# Patient Record
Sex: Female | Born: 1965
Health system: Southern US, Community
[De-identification: ages and names within clinical notes are randomized; demographics above are authoritative.]

## PROBLEM LIST (undated history)

## (undated) DIAGNOSIS — F419 Anxiety disorder, unspecified: Secondary | ICD-10-CM

## (undated) DIAGNOSIS — R87619 Unspecified abnormal cytological findings in specimens from cervix uteri: Secondary | ICD-10-CM

## (undated) DIAGNOSIS — F32A Depression, unspecified: Secondary | ICD-10-CM

## (undated) DIAGNOSIS — F329 Major depressive disorder, single episode, unspecified: Secondary | ICD-10-CM

## (undated) DIAGNOSIS — R5383 Other fatigue: Secondary | ICD-10-CM

## (undated) HISTORY — DX: Major depressive disorder, single episode, unspecified: F32.9

## (undated) HISTORY — DX: Depression, unspecified: F32.A

## (undated) HISTORY — DX: Anxiety disorder, unspecified: F41.9

## (undated) HISTORY — DX: Unspecified abnormal cytological findings in specimens from cervix uteri: R87.619

## (undated) HISTORY — DX: Other fatigue: R53.83

## (undated) HISTORY — PX: COLPOSCOPY: SHX161

---

## 1999-03-24 ENCOUNTER — Other Ambulatory Visit: Admission: RE | Admit: 1999-03-24 | Discharge: 1999-03-24 | Payer: Self-pay | Admitting: Obstetrics and Gynecology

## 2000-04-13 ENCOUNTER — Other Ambulatory Visit: Admission: RE | Admit: 2000-04-13 | Discharge: 2000-04-13 | Payer: Self-pay | Admitting: Obstetrics and Gynecology

## 2000-06-16 ENCOUNTER — Encounter: Payer: Self-pay | Admitting: Obstetrics and Gynecology

## 2000-06-16 ENCOUNTER — Ambulatory Visit (HOSPITAL_COMMUNITY): Admission: RE | Admit: 2000-06-16 | Discharge: 2000-06-16 | Payer: Self-pay | Admitting: Obstetrics and Gynecology

## 2001-06-06 ENCOUNTER — Other Ambulatory Visit: Admission: RE | Admit: 2001-06-06 | Discharge: 2001-06-06 | Payer: Self-pay | Admitting: Obstetrics and Gynecology

## 2002-06-11 ENCOUNTER — Other Ambulatory Visit: Admission: RE | Admit: 2002-06-11 | Discharge: 2002-06-11 | Payer: Self-pay | Admitting: Obstetrics and Gynecology

## 2002-07-31 ENCOUNTER — Encounter: Admission: RE | Admit: 2002-07-31 | Discharge: 2002-07-31 | Payer: Self-pay | Admitting: Obstetrics and Gynecology

## 2002-07-31 ENCOUNTER — Encounter: Payer: Self-pay | Admitting: Obstetrics and Gynecology

## 2003-02-06 ENCOUNTER — Encounter: Admission: RE | Admit: 2003-02-06 | Discharge: 2003-02-06 | Payer: Self-pay | Admitting: Obstetrics and Gynecology

## 2003-02-06 ENCOUNTER — Encounter: Payer: Self-pay | Admitting: Obstetrics and Gynecology

## 2003-09-27 ENCOUNTER — Emergency Department (HOSPITAL_COMMUNITY): Admission: EM | Admit: 2003-09-27 | Discharge: 2003-09-27 | Payer: Self-pay | Admitting: Emergency Medicine

## 2004-07-28 ENCOUNTER — Other Ambulatory Visit: Admission: RE | Admit: 2004-07-28 | Discharge: 2004-07-28 | Payer: Self-pay | Admitting: Obstetrics and Gynecology

## 2005-06-15 ENCOUNTER — Encounter: Admission: RE | Admit: 2005-06-15 | Discharge: 2005-06-15 | Payer: Self-pay | Admitting: General Surgery

## 2005-07-01 ENCOUNTER — Encounter: Admission: RE | Admit: 2005-07-01 | Discharge: 2005-07-01 | Payer: Self-pay | Admitting: General Surgery

## 2005-07-29 ENCOUNTER — Other Ambulatory Visit: Admission: RE | Admit: 2005-07-29 | Discharge: 2005-07-29 | Payer: Self-pay | Admitting: Obstetrics and Gynecology

## 2006-06-23 ENCOUNTER — Encounter: Admission: RE | Admit: 2006-06-23 | Discharge: 2006-06-23 | Payer: Self-pay | Admitting: General Surgery

## 2006-08-11 ENCOUNTER — Other Ambulatory Visit: Admission: RE | Admit: 2006-08-11 | Discharge: 2006-08-11 | Payer: Self-pay | Admitting: Obstetrics and Gynecology

## 2006-11-17 ENCOUNTER — Encounter: Admission: RE | Admit: 2006-11-17 | Discharge: 2006-11-17 | Payer: Self-pay | Admitting: Obstetrics and Gynecology

## 2007-06-26 ENCOUNTER — Encounter: Admission: RE | Admit: 2007-06-26 | Discharge: 2007-06-26 | Payer: Self-pay | Admitting: Obstetrics and Gynecology

## 2007-08-14 ENCOUNTER — Other Ambulatory Visit: Admission: RE | Admit: 2007-08-14 | Discharge: 2007-08-14 | Payer: Self-pay | Admitting: Obstetrics and Gynecology

## 2008-07-01 ENCOUNTER — Encounter: Admission: RE | Admit: 2008-07-01 | Discharge: 2008-07-01 | Payer: Self-pay | Admitting: Obstetrics and Gynecology

## 2008-08-15 ENCOUNTER — Other Ambulatory Visit: Admission: RE | Admit: 2008-08-15 | Discharge: 2008-08-15 | Payer: Self-pay | Admitting: Obstetrics and Gynecology

## 2009-07-02 ENCOUNTER — Encounter: Admission: RE | Admit: 2009-07-02 | Discharge: 2009-07-02 | Payer: Self-pay | Admitting: Obstetrics and Gynecology

## 2009-07-31 ENCOUNTER — Emergency Department (HOSPITAL_COMMUNITY): Admission: EM | Admit: 2009-07-31 | Discharge: 2009-07-31 | Payer: Self-pay | Admitting: Family Medicine

## 2009-08-18 ENCOUNTER — Other Ambulatory Visit: Admission: RE | Admit: 2009-08-18 | Discharge: 2009-08-18 | Payer: Self-pay | Admitting: Obstetrics and Gynecology

## 2010-07-17 ENCOUNTER — Encounter: Admission: RE | Admit: 2010-07-17 | Discharge: 2010-07-17 | Payer: Self-pay | Admitting: Obstetrics and Gynecology

## 2010-08-19 ENCOUNTER — Other Ambulatory Visit
Admission: RE | Admit: 2010-08-19 | Discharge: 2010-08-19 | Payer: Self-pay | Source: Home / Self Care | Admitting: Obstetrics and Gynecology

## 2010-09-20 ENCOUNTER — Encounter: Payer: Self-pay | Admitting: Obstetrics and Gynecology

## 2010-12-01 LAB — POCT RAPID STREP A (OFFICE): Streptococcus, Group A Screen (Direct): POSITIVE — AB

## 2011-01-28 ENCOUNTER — Inpatient Hospital Stay (INDEPENDENT_AMBULATORY_CARE_PROVIDER_SITE_OTHER)
Admission: RE | Admit: 2011-01-28 | Discharge: 2011-01-28 | Disposition: A | Discharge status: Home / Self Care | Payer: 59 | Source: Ambulatory Visit | Attending: Family Medicine | Admitting: Family Medicine

## 2011-01-28 DIAGNOSIS — R040 Epistaxis: Secondary | ICD-10-CM

## 2011-01-28 DIAGNOSIS — J019 Acute sinusitis, unspecified: Secondary | ICD-10-CM

## 2011-06-22 ENCOUNTER — Other Ambulatory Visit: Payer: Self-pay | Admitting: Family Medicine

## 2011-06-22 DIAGNOSIS — Z1231 Encounter for screening mammogram for malignant neoplasm of breast: Secondary | ICD-10-CM

## 2011-07-20 ENCOUNTER — Ambulatory Visit
Admission: RE | Admit: 2011-07-20 | Discharge: 2011-07-20 | Disposition: A | Discharge status: Home / Self Care | Payer: 59 | Source: Ambulatory Visit | Attending: Family Medicine | Admitting: Family Medicine

## 2011-07-20 DIAGNOSIS — Z1231 Encounter for screening mammogram for malignant neoplasm of breast: Secondary | ICD-10-CM

## 2012-06-13 ENCOUNTER — Other Ambulatory Visit: Payer: Self-pay | Admitting: Obstetrics and Gynecology

## 2012-06-13 DIAGNOSIS — Z1231 Encounter for screening mammogram for malignant neoplasm of breast: Secondary | ICD-10-CM

## 2012-07-20 ENCOUNTER — Ambulatory Visit
Admission: RE | Admit: 2012-07-20 | Discharge: 2012-07-20 | Disposition: A | Discharge status: Home / Self Care | Payer: 59 | Source: Ambulatory Visit | Attending: Obstetrics and Gynecology | Admitting: Obstetrics and Gynecology

## 2012-07-20 DIAGNOSIS — Z1231 Encounter for screening mammogram for malignant neoplasm of breast: Secondary | ICD-10-CM

## 2013-06-29 ENCOUNTER — Other Ambulatory Visit: Payer: Self-pay

## 2013-06-29 DIAGNOSIS — Z1231 Encounter for screening mammogram for malignant neoplasm of breast: Secondary | ICD-10-CM

## 2013-08-10 ENCOUNTER — Ambulatory Visit
Admission: RE | Admit: 2013-08-10 | Discharge: 2013-08-10 | Disposition: A | Discharge status: Home / Self Care | Payer: 59 | Source: Ambulatory Visit

## 2013-08-10 DIAGNOSIS — Z1231 Encounter for screening mammogram for malignant neoplasm of breast: Secondary | ICD-10-CM

## 2014-04-24 ENCOUNTER — Telehealth: Payer: Self-pay | Admitting: *Deleted

## 2014-04-24 ENCOUNTER — Ambulatory Visit (INDEPENDENT_AMBULATORY_CARE_PROVIDER_SITE_OTHER): Payer: 59

## 2014-04-24 VITALS — BP 133/80 | HR 66 | Resp 12

## 2014-04-24 DIAGNOSIS — R52 Pain, unspecified: Secondary | ICD-10-CM

## 2014-04-24 DIAGNOSIS — M722 Plantar fascial fibromatosis: Secondary | ICD-10-CM

## 2014-04-24 DIAGNOSIS — G5760 Lesion of plantar nerve, unspecified lower limb: Secondary | ICD-10-CM

## 2014-04-24 MED ORDER — MELOXICAM 15 MG PO TABS: 15.0000 mg | ORAL_TABLET | Freq: Every day | ORAL | Status: DC

## 2014-04-24 NOTE — Telephone Encounter (Signed)
I attempted to return her call.  I left her a message to give me a call back on tomorrow. 

## 2014-04-24 NOTE — Patient Instructions (Signed)

## 2014-04-24 NOTE — Telephone Encounter (Signed)
I made an appointment and it's not until 4th of September.  Long story short, I been having issues with my feet and didn't know if you can help me with something.  I been icing them.  If you would give me a call back it would be greatly appreciated

## 2014-04-24 NOTE — Progress Notes (Signed)
   Subjective:    Patient ID: Jaime [name], female    DOB: 14-Nov-1965, 48 y.o.   MRN: [id number]  HPI  PT STATED BALL OF THE FOOT  AND BETWEEN THE TOES ARE SORE AND HAVE TINGLING SENSATION. THE FOOT AND TOES ARE GETTING WORSE. THE FOOT GET AGGRAVATED BY PRESSURE AND TOES WHEN SITTING. TRIED ICE AND IBUFROPEN IT HELP SOME.    Review of Systems  All other systems reviewed and are negative.      Objective:   Physical Exam 48 year old white female well-developed well-nourished oriented x3 vital signs stable. Patient has a several month history of increasing pain sensation burning and stinging particular her fourth toes left more so than right in the past is been treated by Dr. Leeanne Deed for neuroma as well as plantar fasciitis has orthotics from 7 or 8 years ago that are worn need replacing recovering. Currently wearing and athletic shoe with a good support and stability however previously resected wearing different shoes made aggravated the situation.  Lower extremity objective findings reveal neurovascular status is intact pedal pulses palpable DP and PT +2/4 bilateral capillary refill time 3 seconds all digits epicritic sensation intact and symmetric bilateral normal plantar response DTRs not elicited. Neurologically skin color pigment normal hair growth absent nails somewhat criptotic orthopedic exam is pain on palpation of the medial band of the plantar fascia bilateral only from mid arch medial calcaneal tubercle area consistent with plantar fasciitis. Also pain tenderness on direct lateral compression fourth interspace left more so than right positive Yancey Flemings sign there is a double click on compression of the interspace consistent with neuroma left foot more severe and intense patient describing a sharp pain and shooting pins and needles sensation at times and toes no history of injury trauma or patient is doing some exercises such as boot camp kick box and Zumba exercise classes to may be doing  excessive activities and over stressed her overwork the plantar fascia as well as forefoot become compensatory gait changes.       Assessment & Plan:  Assessment plantar fasciitis/heel spur syndrome bilateral left more so than right and secondary Morton's neuroma symptomology left more so than right third interspace bilateral plan at this time patient placed on a regimen of MOBIC 15 mg meloxicam once daily as instructed recommended ice to the affected area and fascial strapping applied to both feet maintain good stable shoe and currently wearing the same orthotics may be candidate for you orthoses in the future possibly to consider steroid injection if no improvement within the next 2 weeks. Followup in 2 weeks for reevaluation  Alvan Dame DPM

## 2014-05-01 ENCOUNTER — Telehealth: Payer: Self-pay | Admitting: *Deleted

## 2014-05-01 MED ORDER — DICLOFENAC SODIUM 75 MG PO TBEC: 75.0000 mg | DELAYED_RELEASE_TABLET | Freq: Two times a day (BID) | ORAL | Status: DC

## 2014-05-01 NOTE — Telephone Encounter (Signed)
I called and left the patient a message to stop taking the Mobic.  We are sending a prescription for Diclofenac 75 mg, quantity of 60, take one tablet twice daily to [facility] per Dr. Ralene Cork.  Call if you have any further questions or concerns.

## 2014-05-01 NOTE — Telephone Encounter (Signed)
You just called and left me a message that he is giving me another prescription.  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  I told her no, he did not say anything about you being off your feet.  She stated so he thinks this new medicine will help?  I told her yes, he thinks it will help.  She stated okay, as she whimpered, I hope so.

## 2014-05-01 NOTE — Telephone Encounter (Signed)
I was in your office last week seeing Dr. Ralene Cork.  He gave me some medicine for my feet.  I'm still having a lot of issues.  I didn't know if he could do something such as give me a different type of medicine or if I need to talk to him.  Please give me a call back.

## 2014-05-03 ENCOUNTER — Ambulatory Visit: Payer: Self-pay

## 2014-05-08 ENCOUNTER — Ambulatory Visit (INDEPENDENT_AMBULATORY_CARE_PROVIDER_SITE_OTHER): Payer: 59

## 2014-05-08 ENCOUNTER — Encounter: Payer: Self-pay | Admitting: *Deleted

## 2014-05-08 VITALS — BP 111/67 | HR 73 | Resp 15

## 2014-05-08 DIAGNOSIS — G5760 Lesion of plantar nerve, unspecified lower limb: Secondary | ICD-10-CM

## 2014-05-08 DIAGNOSIS — R52 Pain, unspecified: Secondary | ICD-10-CM

## 2014-05-08 DIAGNOSIS — M722 Plantar fascial fibromatosis: Secondary | ICD-10-CM

## 2014-05-08 MED ORDER — TRIAMCINOLONE ACETONIDE 10 MG/ML IJ SUSP: 10.0000 mg | Freq: Once | INTRAMUSCULAR | Status: DC

## 2014-05-08 NOTE — Progress Notes (Signed)
   Subjective:    Patient ID: Jaime [name], female    DOB: 1966-07-13, 48 y.o.   MRN: [id number]  HPI Comments: Pt states she has had very slow improvement and had to change from Mobic to the Voltaren 75 mg bid.   Foot Pain      Review of Systems no new findings or systemic changes the     Objective:   Physical Exam Lower extremity objective findings unchanged pedal pulses are palpable epicritic and proprioceptive sensations intact patient had less pain in the ball of the foot lesser the neuroma symptomology burning or stinging however continues have pain mid band of plantar fascia bilateral. Mid arch painful tender dry compression the taping didn't seem to help as much of frustrating have to take up further bathing or showering. At this time has slight improvement although not enough she is wearing crocs and wearing a good sandal with some support. She is wearing a Naot shoe with adequate support to she does have orthotics that need recovering refurbishing her insurance does not cover orthotics which be beneficial. 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       Assessment & Plan:  Assessment persistent plantar fasciitis/heel spur syndrome some improvement in neuroma symptomology is in a stack patient benefit from orthotic refurbishing adjustments will recover her orthotic for her patient be contacted when orthotics rate for fitting at this time recommended injection tender with Kenalog 20 mg Xylocaine plain infiltrated to the medial band of the plantar fascia and mid arch bilateral to steroid injections delivered at this time patient Dimas Aguas well will maintain followup with ice maintain them and anti-inflammatory meloxicam and recheck within 1 month for followup and reevaluation. May be  candidate for more aggressive or invasive options are a prescription orthoses in the future if no improvement  Alvan Dame DPM

## 2014-05-08 NOTE — Patient Instructions (Signed)

## 2014-05-21 ENCOUNTER — Telehealth: Payer: Self-pay | Admitting: *Deleted

## 2014-05-21 NOTE — Telephone Encounter (Signed)
I think Dr. Ralene Cork was just going to do a padding on the top part of my orthotics.  I didn't think we were going to do new orthotics.  I you'd give me a call back, I'd greatly appreciate it.  Thank you.  I returned her call and informed her that the orthotics are ready.  The cost is $35.  She said she would come by to pick them up tomorrow.  Wanted to have them before she went on her trip to [place].

## 2014-05-21 NOTE — Telephone Encounter (Signed)
I called last week double checking on my orthotics.  If you would give me a call back. I have not gotten a call back since last week as far as letting me know what the deal is with these.  It'll be 2 weeks Wednesday.  I called and left her a message that the orthotics are not here yet.  Hopefully they will be her sometime this week.  We will contact you when they arrive to schedule an appointment.  It takes 2-3 weeks for them to arrive.

## 2014-05-22 DIAGNOSIS — M722 Plantar fascial fibromatosis: Secondary | ICD-10-CM

## 2014-06-04 ENCOUNTER — Ambulatory Visit (INDEPENDENT_AMBULATORY_CARE_PROVIDER_SITE_OTHER): Payer: 59

## 2014-06-04 VITALS — BP 125/78 | HR 74 | Resp 17

## 2014-06-04 DIAGNOSIS — R52 Pain, unspecified: Secondary | ICD-10-CM

## 2014-06-04 DIAGNOSIS — G5760 Lesion of plantar nerve, unspecified lower limb: Secondary | ICD-10-CM

## 2014-06-04 DIAGNOSIS — M722 Plantar fascial fibromatosis: Secondary | ICD-10-CM

## 2014-06-04 MED ORDER — TRIAMCINOLONE ACETONIDE 10 MG/ML IJ SUSP: 10.0000 mg | Freq: Once | INTRAMUSCULAR | Status: DC

## 2014-06-04 NOTE — Patient Instructions (Signed)

## 2014-06-04 NOTE — Progress Notes (Signed)
   Subjective:    Patient ID: [name] L Zahner, female    DOB: 21-Mar-1966, 48 y.o.   MRN: [id number]  HPI Pt still has PF pain in bilateral feet, still has tingling pain, has been icing TID, does claim that the anti-inflammatory has helped   Review of Systems no new findings or systemic changes noted     Objective:   Physical Exam Neurovascular status is intact pedal pulses are palpable the right foot is improved having started back into her orthotics and injections in both arches last visit the right foot is improved there is no regional pain or symptomology on present plantar fascia palpation however the left is exquisitely tender mid band mid arch of the left plantar fascia. There is no new other finding no new changes currently still taking diclofenac as instructed also maintain ice 2 or 3 times daily as instructed she'll he recently got her orthotics back is wearing them she is just now starting to use to just as the orthotics that she has not used for a long time. No new findings open wounds the ulcers no other changes slight improvement in plantar fascial symptomology noted also continues to have some neuroma-like symptomology possibly secondary to swelling or inflammation of ligaments       Assessment & Plan:  Assessment plantar fasciitis/heel spur syndrome left more so than right posteriorly neuroma symptomology at this time a second injection tender with Kenalog 20 mg Marcaine plain infiltrated the inferior left mid arch medial band of the plantar fascia. Patient how is well we'll continue with ice maintain orthoses recheck in one to 2 months for adjustments if needed  Alvan Dameichard Placido Hangartner DPM

## 2014-07-02 ENCOUNTER — Ambulatory Visit (INDEPENDENT_AMBULATORY_CARE_PROVIDER_SITE_OTHER): Payer: 59

## 2014-07-02 VITALS — BP 138/75 | HR 78 | Resp 13

## 2014-07-02 DIAGNOSIS — M722 Plantar fascial fibromatosis: Secondary | ICD-10-CM

## 2014-07-02 DIAGNOSIS — G5760 Lesion of plantar nerve, unspecified lower limb: Secondary | ICD-10-CM

## 2014-07-02 NOTE — Patient Instructions (Signed)

## 2014-07-02 NOTE — Progress Notes (Signed)
   Subjective:    Patient ID: Jaime [name], female    DOB: June 24, 1966, 48 y.o.   MRN: [id number]  HPI Comments: Pt states she is doing better.     Review of Systems no new findings or systemic changes noted     Objective:   Physical Exam Lower extremity objective findings unchanged pedal pulses are palpable patient has had orthotics and had injections at last visit both arches and heels has greatly improved having minimal or no symptoms still taking the diclofenac no pain or the morning are first up in the morning not return back athletic activities. Orthotics fit and contour well with no other complaints at this time       Assessment & Plan:  Assessment resolved plantar fasciitis/heel spur syndrome continue maintain orthoses appropriate shoes at all times continue with anti-inflammatory on a flareups contact us if they recur may resume athletic or workout activities consider water exercise patient bike and she walking lately impact activities if increasing gradually over time with rates and duration. Discharge to an as-needed basis for future follow-up  Alvan Dameichard Sharran Caratachea DPM

## 2014-07-03 ENCOUNTER — Telehealth: Payer: Self-pay | Admitting: *Deleted

## 2014-07-03 NOTE — Telephone Encounter (Signed)
Calling in regards to Diclofenac.  Patient said she is supposed to wean off it but she doesn't have any refills.  I read Dr.Sikora's chart note and he stated he wants her to take the anti-inflammatory as needed for flare-ups.  I gave her a verbal to give patient 2 refills.

## 2014-08-01 ENCOUNTER — Ambulatory Visit (INDEPENDENT_AMBULATORY_CARE_PROVIDER_SITE_OTHER): Payer: 59 | Admitting: Podiatry

## 2014-08-01 VITALS — BP 126/84 | HR 82 | Resp 17

## 2014-08-01 DIAGNOSIS — M722 Plantar fascial fibromatosis: Secondary | ICD-10-CM

## 2014-08-01 MED ORDER — TRIAMCINOLONE ACETONIDE 10 MG/ML IJ SUSP
10.0000 mg | Freq: Once | INTRAMUSCULAR | Status: AC
  Administered 2014-08-01: 10 mg

## 2014-08-01 NOTE — Progress Notes (Signed)
   Subjective:    Patient ID: Jaime [name], female    DOB: March 14, 1966, 48 y.o.   MRN: [id number]  HPI Pt presents with worsening pain and tingling in toes. Started taking a new antidepressant with anti-inflammatory medication and states that it caused the pain and tingling to worsen   Review of Systems     Objective:   Physical Exam        Assessment & Plan:

## 2014-08-01 NOTE — Progress Notes (Signed)
Subjective:     Patient ID: Jaime [name], female   DOB: [birth year], [age] y.o.   MRN: [id number]  HPI patient presents stating I'm having terrible pain in my arches of both feet and I started some new medicine and I don't think it is doing well with my anti-inflammatory   Review of Systems     Objective:   Physical Exam Neurovascular status found to be intact with muscle strength adequate and found to have exquisite discomfort in the central and medial band in the mid arch area of the plantar fascia    Assessment:     Acute plantar fasciitis bilateral    Plan:     Reinjected the plantar fascia 3 mg Kenalog 5 g Xylocaine and advised on reduced activities. I dispensed fascial brace to both feet in order to support the plantar arch and take pressure off the underlying tendon

## 2014-08-15 ENCOUNTER — Ambulatory Visit (HOSPITAL_COMMUNITY)
Admission: AD | Admit: 2014-08-15 | Discharge: 2014-08-15 | Disposition: A | Discharge status: Home / Self Care | Payer: 59 | Source: Intra-hospital | Attending: Psychiatry | Admitting: Psychiatry

## 2014-08-15 DIAGNOSIS — F411 Generalized anxiety disorder: Secondary | ICD-10-CM | POA: Diagnosis not present

## 2014-08-15 NOTE — [facility] Counselor (Signed)
Pt was a walk-in. Counselor completed assessment. Per Renata Capriceonrad Pt meets outpatient criteria. Counselor made a outpatient appointment for the Pt [facility]  Wolfgang PhoenixBrandi Nesta Scaturro, [facility] Triage Specialist

## 2014-08-15 NOTE — BH Assessment (Signed)
Assessment Note  Jaime [name] is an 48 y.o. female. Pt reports anxiety and panic attacks. Pt states she is not SI/HI. Pt reports no delusions or hallucinations. According to the Pt, she is extremely stressed. Pt states that she worries about "everything." Pt reports that she is overwhelmed with her job. Pt states that she needs coping skills to learn how to handle stress. Pt states that for the past two weeks she has been having panic attacks. Pt reports that she had a panic attack today and it was "the worst." Pt has never received outpatient nor inpatient mental health services. Pt has been prescribed Xanax but she does not feel the medication is effective. Pt's PCP prescribes that Pt's Xanax. Pt states she is seeking outpatient treatment.  The writer consulted with NP-Conrad. Per Renata Capriceonrad Pt does not meet inpatient criteria. Per Renata Capriceonrad Pt meets outpatient treatment criteria.  [facility] for the Pt. The Pt is scheduled [facility]   Axis I: Generalized Anxiety Disorder Axis II: Deferred Axis III: No past medical history on file. Axis IV: problems related to social environment Axis V: 61-70 mild symptoms  Past Medical History: No past medical history on file.  No past surgical history on file.  Family History: No family history on file.  Social History:  reports that she has never smoked. She does not have any smokeless tobacco history on file. She reports that she drinks alcohol. She reports that she does not use illicit drugs.  Additional Social History:  Alcohol / Drug Use Pain Medications: Pt denies Prescriptions: Pt denies Over the Counter: Pt denies History of alcohol / drug use?: No history of alcohol / drug abuse Longest period of sobriety (when/how long): NA  CIWA:   COWS:    Allergies:  Allergies  Allergen Reactions  . Penicillins     Home Medications:  (Not in a  hospital admission)  OB/GYN Status:  No LMP recorded. Patient is not currently having periods (Reason: Perimenopausal).  General Assessment Data Location of Assessment: [facility] Assessment Services ACT Assessment: Yes Is this a Tele or Face-to-Face Assessment?: Face-to-Face Is this an Initial Assessment or a Re-assessment for this encounter?: Initial Assessment Living Arrangements: Spouse/significant other Can pt return to current living arrangement?: Yes Admission Status: Voluntary Is patient capable of signing voluntary admission?: Yes Transfer from: Home Referral Source: Self/Family/Friend  Medical Screening Exam [facility] Walk-in ONLY) Medical Exam completed: Yes  [facility] Living Arrangements: Spouse/significant other Name of Psychiatrist: None Name of Therapist: None  Education Status Is patient currently in school?: No Current Grade: NA Highest grade of school patient has completed: 12 Name of school: NA Contact person: NA  Risk to self with the past 6 months Suicidal Ideation: No Suicidal Intent: No Is patient at risk for suicide?: No Suicidal Plan?: No Access to Means: No What has been your use of drugs/alcohol within the last 12 months?: NA Previous Attempts/Gestures: No How many times?: 0 Other Self Harm Risks: None Triggers for Past Attempts: None known Intentional Self Injurious Behavior: None Family Suicide History: No Recent stressful life event(s): Other (Comment) (Job) Persecutory voices/beliefs?: No Depression: Yes Depression Symptoms: Feeling angry/irritable, Guilt Substance abuse history and/or treatment for substance abuse?: No Suicide prevention information given to non-admitted patients: Not applicable  Risk to Others within the past 6 months Homicidal Ideation: No Thoughts of Harm to Others: No Current Homicidal Intent: No Current  Homicidal Plan: No Access to Homicidal Means: No Identified Victim: None History of harm to others?:  No Assessment of Violence: None Noted Violent Behavior Description: None Does patient have access to weapons?: No Criminal Charges Pending?: No Does patient have a court date: No  Psychosis Hallucinations: None noted Delusions: None noted  Mental Status Report Appear/Hygiene: Unremarkable Eye Contact: Good Motor Activity: Freedom of movement Speech: Logical/coherent Level of Consciousness: Alert Mood: Depressed Affect: Appropriate to circumstance Anxiety Level: Minimal Thought Processes: Coherent, Relevant Judgement: Unimpaired Orientation: Person, Place, Time, Situation Obsessive Compulsive Thoughts/Behaviors: Minimal  Cognitive Functioning Concentration: Good Memory: Recent Intact, Remote Intact IQ: Average Insight: Fair Impulse Control: Fair Appetite: Fair Weight Loss: 0 Weight Gain: 0 Sleep: No Change Total Hours of Sleep: 7 Vegetative Symptoms: None  ADLScreening [facility] Assessment Services) Patient's cognitive ability adequate to safely complete daily activities?: Yes Patient able to express need for assistance with ADLs?: Yes Independently performs ADLs?: Yes (appropriate for developmental age)  Prior Inpatient Therapy Prior Inpatient Therapy: No Prior Therapy Dates: NA Prior Therapy Facilty/Provider(s): NA Reason for Treatment: NA  Prior Outpatient Therapy Prior Outpatient Therapy: No Prior Therapy Dates: NA Prior Therapy Facilty/Provider(s): NA Reason for Treatment: NA  ADL Screening (condition at time of admission) Patient's cognitive ability adequate to safely complete daily activities?: Yes Is the patient deaf or have difficulty hearing?: No Does the patient have difficulty seeing, even when wearing glasses/contacts?: No Does the patient have difficulty concentrating, remembering, or making decisions?: No Patient able to express need for assistance with ADLs?: Yes Does the patient have difficulty dressing or bathing?: No Independently performs  ADLs?: Yes (appropriate for developmental age) Does the patient have difficulty walking or climbing stairs?: No Weakness of Legs: None       Abuse/Neglect Assessment (Assessment to be complete while patient is alone) Physical Abuse: Denies Verbal Abuse: Denies Sexual Abuse: Denies Exploitation of patient/patient's resources: Denies Self-Neglect: Denies Values / Beliefs Cultural Requests During Hospitalization: None Spiritual Requests During Hospitalization: None   Advance Directives (For Healthcare) Does patient have an advance directive?: No Would patient like information on creating an advanced directive?: No - patient declined information    Additional Information 1:1 In Past 12 Months?: No CIRT Risk: No Elopement Risk: No Does patient have medical clearance?: Yes     Disposition:  Disposition Initial Assessment Completed for this Encounter: Yes Disposition of Patient: Outpatient treatment (Appointment for 08/27/14 at 8am) Type of outpatient treatment: Adult  On Site Evaluation by:   Reviewed with Physician:    Emmit PomfretLevette,Bane Hagy D 08/15/2014 8:28 PM

## 2014-08-27 ENCOUNTER — Encounter (INDEPENDENT_AMBULATORY_CARE_PROVIDER_SITE_OTHER): Payer: Self-pay

## 2014-08-27 ENCOUNTER — Encounter (HOSPITAL_COMMUNITY): Payer: Self-pay | Admitting: Clinical

## 2014-08-27 ENCOUNTER — Ambulatory Visit (INDEPENDENT_AMBULATORY_CARE_PROVIDER_SITE_OTHER): Payer: 59 | Admitting: Clinical

## 2014-08-27 DIAGNOSIS — F331 Major depressive disorder, recurrent, moderate: Secondary | ICD-10-CM | POA: Diagnosis not present

## 2014-08-27 DIAGNOSIS — F32A Depression, unspecified: Secondary | ICD-10-CM

## 2014-08-27 DIAGNOSIS — F329 Major depressive disorder, single episode, unspecified: Secondary | ICD-10-CM

## 2014-08-27 NOTE — Progress Notes (Signed)
Patient:   Jaime [name]   DOB:   1965-11-20  MR Number:  [id number]  Location:  [facility] [place] [address] [id number] [place] [id number] Dept: [phone]           Date of Service:   08/27/2014  Start Time:   8:00 End Time:   9:00  Provider/Observer:  Erby PianFrances A Duff Pozzi Counselor       Billing Code/Service: [phone]  Behavioral Observation: Jaime MeekStephanie L Klausner  presents as a 48 y.o.-year-old Caucasian Female who appeared her stated age. her dress was Appropriate and she was Neat and her manners were Appropriate to the situation.  There were not any physical disabilities noted.  she displayed an appropriate level of cooperation and motivation.    Interactions:    Active   Attention:   normal  Memory:   normal  Speech (Volume):  normal  Speech:   normal pitch and normal volume  Thought Process:  Coherent and Relevant  Though Content:  WNL  Orientation:   person, place, time/date and situation  Judgment:   Good  Planning:   Good  Affect:    Depressed and Tearful  Mood:    Depressed  Insight:   Good  Intelligence:   normal  Chief Complaint:     Chief Complaint  Patient presents with  . Anxiety  . Depression  . Stress    Reason for Service:  Referred by Dr Clelia CroftShaw.  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 Adopted at 343 days old. Lived with Mother and Father with 3 brothers and one sister. All are adopted but my youngest brother. Growing up was great. My Dad's first wife passed away when I was 2 or 3. He remarried when I was 5 they would have been married 40 years, 2 years ago. My Dad passed away at 4084 , 2 years ago. I am closest with my youngest brother and 2nd brother. We are all there for each other we are just not as close with the others. I know if I pick up the phone they are there for me. I lived with my parents until I was [age]. About the same time I started dating my future husband. When they moved I lived with them a couple of months and moved back to [place]. Married to [place], its been rocky off and on. We have had issues with my moodiness and depression. He is very supportive. He wants me to be happy.   Natural/Informal Support:  Molly Maduro, my Mom, my friend Myna Hidalgo   Substance Use:  No concerns of substance abuse are reported.     Medical History:   Past Medical History  Diagnosis Date  . Anxiety   . Depression   . Fatigue           Medication List       This list is accurate as of: 08/27/14  8:23 AM.  Always use your most recent med list.               ALPRAZolam 0.5 MG tablet  Commonly known as:  XANAX  Take 0.5 mg by mouth at bedtime as needed for anxiety (via Dr. Clelia Croft family Physcian).     amitriptyline 10 MG tablet  Commonly known as:  ELAVIL  Take 10 mg by mouth at bedtime.     CALCIUM 1200 PO  Take by mouth.     diclofenac 75 MG EC tablet  Commonly known as:  VOLTAREN  Take 1 tablet (75 mg total) by mouth 2 (two) times daily.     lidocaine 5 % ointment  Commonly known as:  XYLOCAINE  Apply 1 application topically as needed (via Dr. Senaida Ores).     multivitamin tablet  Take 1 tablet by mouth daily.               Sexual History:   History  Sexual Activity  . Sexual Activity: No     Abuse/Trauma History: Physical  - None                                                 Emotional and Mental - None                                                 Sexual - None                                                 Trauma - None   Psychiatric History:  Denies any past inpatient treatment                                                 Saw a therapist several years ago - for Anxiety and Depression   Strengths:   "I think I am supportive of others, I work outside, I am loving."   Recovery Goals:  "To get back with my life, to get on with my life. To not feel depressed and worthless feeling."  Hobbies/Interests:              "Exercising until my feet got bad,  I get on the computer and do stuff on the computer, and working outside."   Challenges/Barriers: "Self confidence in myself. Making myself, telling myself that I do have support, that its not something to be ashamed of."    Family Med/Psych History:  Family History  Problem Relation Age of Onset  .  Adopted: Yes  . Family history unknown: Yes    Risk of Suicide/Violence: low  Denies any current suicidal or homicidal ideation. "n the beginning of this I was in such a depressed stated that I felt like not being here."  History of Suicide/Violence:  N/A  Psychosis:   N/A  Diagnosis:    Major depressive disorder, recurrent episode, moderate  Impression/DX:    Jaime MeekStephanie L Cheyney  presents as a 48 y.o.-year-old Caucasian Female with Depression. She stated that the depression began at 5-7 years ago." It night be longer than that." " I have gone through several anti depressants. The last ione I was on was [employer] and it worked best." "I stopped taking it because I was feeling so good, and felt like I had my life in control. I also felt like God was telling me to stop taking it, that I was better."  "The anxiety began in the last year.  I think it was triggered by my health issues and feeling like I couldn't control things."  She reports issues around menopause which the doctors were not able to pinpoint until a couple of months ago.  Ms. Elsie LincolnRouth reports symptoms of depression, crying spells, anxiety, mood swings (including anger), appetite change, sleep troubles, irritability, excessive worry, low energy and fatigue.  She reports "wanting control and life back!" She share that the anxiety and depression became increasingly worse over the last several months. Ms. Elsie LincolnRouth reports that she has been avoiding people. She shared that the combination of mental and physical health issues caused her to miss work and family functions including (recent) Christmas eve and Christmas. She reported being prescribed Xanax by her family physician. She stated that while on anti depressants she would rarely take a Xanax. She shared that she has been taking Xanax daily for the last week and a half.  Client and clinician discussed the possibility of her seeing a psychiatrist who might be able to assist with her mental health medication.      Recommendation/Plan: Individual therapy 1x weekly, follow safety plan as needed,

## 2014-08-28 DIAGNOSIS — F32A Depression, unspecified: Secondary | ICD-10-CM | POA: Insufficient documentation

## 2014-08-28 DIAGNOSIS — F329 Major depressive disorder, single episode, unspecified: Secondary | ICD-10-CM | POA: Insufficient documentation

## 2014-09-04 ENCOUNTER — Ambulatory Visit (INDEPENDENT_AMBULATORY_CARE_PROVIDER_SITE_OTHER): Payer: 59 | Admitting: Clinical

## 2014-09-04 DIAGNOSIS — F331 Major depressive disorder, recurrent, moderate: Secondary | ICD-10-CM

## 2014-09-05 ENCOUNTER — Encounter (HOSPITAL_COMMUNITY): Payer: Self-pay | Admitting: Clinical

## 2014-09-05 NOTE — Progress Notes (Signed)
   THERAPIST PROGRESS NOTE  Session Time: 8:05 -9:00  Participation Level: Active  Behavioral Response: NeatAlertDepressed  Type of Therapy: Individual Therapy  Treatment Goals addressed: Depression, emotional regulation skills, calming skills  Interventions: Motivational Interviewing and Other: Grounding and mindfulness techniques, and role play  Summary: Jaime [name] is a 49 y.o. female who presents with major depressive disorder.   Suicidal/Homicidal: Nowithout intent/plan  Therapist Response:  Jaime [name] met with clinician for an individual session. She discussed her week's events and her psychiatric symptoms. [name]  she had been experiencing a lot of depression. She shared that she had begun taking Zoloft again and that starting again had been challenging due to the side affects. 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 She shared that her mother had been making the calls. [name] denied any current suicidal or homicidal ideation. She shared that she has been able to "Get through" by reminding herself that it is temporary and that she is taking the necessary steps to get better. Client and clinician discussed some of her coping strategies.  Clinician introduced grounding techniques, clinician explained the purpose and application of them. Clinician gave client a list (with explanations) of  Some mindfulness and grounding techniques. Client and clinician practiced some of the grounding techniques together.  Client shared that her husband is a great support for her, but she is having trouble communicating about her challenges with her husband. Client and clinician role played some approaches.  [name] agreed to practice her grounding techniques daily until next session.   Plan: Return again in 1  week.  Diagnosis: Axis I: major Depressive Disorder     [name] A, LCSW 09/05/2014

## 2014-09-11 ENCOUNTER — Ambulatory Visit (INDEPENDENT_AMBULATORY_CARE_PROVIDER_SITE_OTHER): Payer: 59 | Admitting: Clinical

## 2014-09-11 ENCOUNTER — Encounter (HOSPITAL_COMMUNITY): Payer: Self-pay | Admitting: Clinical

## 2014-09-11 DIAGNOSIS — F331 Major depressive disorder, recurrent, moderate: Secondary | ICD-10-CM

## 2014-09-11 NOTE — Progress Notes (Signed)
THERAPIST PROGRESS NOTE  Session Time: 8:03 - (:05  Participation Level: Active  Behavioral Response: Neat and Well GroomedAlertNA  Type of Therapy: Individual Therapy  Treatment Goals addressed: calming skills ,emotional regulation skills, Distress tolerance Skills  Interventions: CBT, Motivational Interviewing and Other: Grounding and mindfulness techniques  Summary: ELISAVET BUEHRER is a 49 y.o. female who presents with Major depressive disorder, recurrent episode, moderate.   Suicidal/Homicidal: Nowithout intent/plan  Therapist Response:  Colletta Maryland met with clinician for an individual session. Mayrin shared that she has been feeling a bit better. She gave credit to the medication beginning to work and following through with her distress tolerance and grounding techniques homework.  Client and clinician reviewed her homework and her thoughts and insights about the homework. She shared that the grounding techniques were helpful for putting a pause in her negative thought patterns. She shared that she practiced as agreed to. She reported that they helped reduce her anxiety prior to making phone calls to clients (something she was unable to bring herself to do last week). Client and clinician reviewed her distress tolerance (cbt) homework. She shared the things presented that she identified with and her answers to questions  form the homework. Kaylyn shared about some events that were challenging in the past. Clinician used one of the events to demonstrate how to use a 7 panel cbt thought record sheet. Ellar agreed to contineu her homework until next session.  Plan: Return again in 1 week.  Diagnosis: Axis I: Major depressive disorder, recurrent episode, moderate      Kyiesha Millward A, LCSW 09/11/2014

## 2014-09-19 ENCOUNTER — Ambulatory Visit (INDEPENDENT_AMBULATORY_CARE_PROVIDER_SITE_OTHER): Payer: 59 | Admitting: Clinical

## 2014-09-19 ENCOUNTER — Encounter (HOSPITAL_COMMUNITY): Payer: Self-pay | Admitting: Clinical

## 2014-09-19 DIAGNOSIS — F331 Major depressive disorder, recurrent, moderate: Secondary | ICD-10-CM

## 2014-09-23 NOTE — Progress Notes (Signed)
   THERAPIST PROGRESS NOTE  Session Time: 9:00 -9:58  Participation Level: Active  Behavioral Response: Casual and NeatAlertNA  Type of Therapy: Individual Therapy  Treatment Goals addressed: emotional regulation skills, Distress tolerance Skills. calming skills   Interventions: CBT, Motivational Interviewing and Other: Grounding and Mindfulness  Summary: Jaime [name] is a 49 y.o. female who presents with Major depressive disorder, recurrent episode, moderate.   Suicidal/Homicidal: Nowithout intent/plan  Therapist Response:  Jaime [name] met with clinician for an individual session. Client and clinician discussed her psychiatric symptoms and her homework. Jaime [name] shared that the medication and therapy were working well for her and she experiencing improvement in her symptoms. Jaime [name] shared that she has been more able to interact with her clients. She shared that she has been using the skills taught in therapy to challenge some of her negative thoughts. Jaime [name] had completed some 7 panel cbt thought record sheets. Client and clinician reviewed and discussed the worksheets, her negative automatic thoughts, the evidence that did and did not support the thoughts, and healthier alternative thoughts. Jaime [name] and clinician discussed her thoughts and insights about the process. Client and clinician discussed additional ways to challenge unhealthy thought patterns. Jaime [name] also reviewed and discussed her cbt distress tolerance homework. Jaime [name] shared her thoughts and insights. Clinician expanded on the topics presented. Jaime [name] and clinician discussed her grounding techniques and her continued practice and application.  Plan: Return again in 1 week.  Diagnosis: Axis I: Major depressive disorder, recurrent episode, moderate        Sidney Kann A, LCSW 09/23/2014

## 2014-10-01 ENCOUNTER — Ambulatory Visit (INDEPENDENT_AMBULATORY_CARE_PROVIDER_SITE_OTHER): Payer: 59 | Admitting: Clinical

## 2014-10-01 ENCOUNTER — Encounter (HOSPITAL_COMMUNITY): Payer: Self-pay | Admitting: Clinical

## 2014-10-01 DIAGNOSIS — F331 Major depressive disorder, recurrent, moderate: Secondary | ICD-10-CM

## 2014-10-01 NOTE — Progress Notes (Signed)
   THERAPIST PROGRESS NOTE  Session Time: 8:02 - 9:00  Participation Level: Active  Behavioral Response: Casual and NeatAlertNA  Type of Therapy: Individual Therapy  Treatment Goals addressed: Anxiety, emotional regulation skills, Distress tolerance Skills Interventions: CBT, Motivational Interviewing and Other: Grounding and mindfulness techniques  Summary: Jaime [name] is [name] 49 y.o. female who presents with Major depressive disorder, recurrent episode, moderate .   Suicidal/Homicidal: Nowithout intent/plan  Therapist Response:  Jaime [name] met with clinician for an individual session. Jaime [name] discussed her psychiatric symptoms, her current life events and her homework. Jaime [name] shared that her medication was increased. She shared that she had [name] challenging week because of the side effects that come temporarily with the increase. She shared that she had practiced some of the skills presented in session. She reported that she was able to voice to her husband some of the things that she thought he could do to improve his communication with her - he responded well and made the adjustment. 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 Jaime [name] and clinician reviewed and discussed her cbt homework on distress tolerance. Jaime [name] shared her thoughts and insight. Client and clinician discussed these and Jaime [name] presented examples. Jaime [name] reported on keeping [name] gratitude journal. Client and clinician discussed her process and clinician gave her suggestions to deepen her practice. Jaime [name] reported [name] lapse in practicing her grounding techniques. Jaime [name] agreed to Sports coach and to complete her cbt and journal homework. She agreed to do so daily until next session  Plan: Return again in 1-2 weeks.  Diagnosis: Axis I: Major depressive disorder, recurrent episode,  moderate       Jaime Christoph A, LCSW 10/01/2014

## 2014-10-10 ENCOUNTER — Encounter (HOSPITAL_COMMUNITY): Payer: Self-pay | Admitting: Clinical

## 2014-10-10 ENCOUNTER — Ambulatory Visit (INDEPENDENT_AMBULATORY_CARE_PROVIDER_SITE_OTHER): Payer: 59 | Admitting: Clinical

## 2014-10-10 DIAGNOSIS — F331 Major depressive disorder, recurrent, moderate: Secondary | ICD-10-CM

## 2014-10-10 NOTE — Progress Notes (Signed)
   THERAPIST PROGRESS NOTE  Session Time: 9:02 -10:05  Participation Level: Active  Behavioral Response: Casual and NeatAlertDepressed  Type of Therapy: Individual Therapy  Treatment Goals addressed: improve psychiatric symptoms, emotional regulation skills, Distress tolerance Skills, calming skills   Interventions: CBT, Motivational Interviewing and Other: mindfulness and grounding techniques  Summary: Jaime [name] is a 49 y.o. female who presents with Major Depressive Disorder.   Suicidal/Homicidal: Nowithout intent/plan  Therapist Response:  Jaime [name] met with clinician for an individual session. Jaime [name] discussed her psychiatric symptoms, her current life events, and her homework. Jaime [name] was tearful as she shared that it had been a challenging week for her. She shared that she was doing more work, had visited a friend with pancreatic cancer and was still adjusting to her medication increase. She shared that she then felt overwhelmed. She shared that she had shared her thoughts and feelings with her husband and then felt guilty for "dumping on him. Clinician introduced a new grounding technique and client and clinician practiced the technique together. [name] reported feeling calmer. Client and clinician used cbt (7 panel thought record sheets) To work through some of Jaime [name]'s beliefs. Jaime [name] had shared that one of her beliefs that was making her sad had to do with her belief that she was not doing enough. When client and clinician discussed the evidence for and against this belief it became evident to Jaime [name] that she was steadily improving and that it was unrealistic for her to expect herself to be perfect every day. Client and clinician discussed the way [name] shares with her husband. Jaime [name] shared that she had asked permission before sharing her emotions and he had agreed. Through further exploration Jaime [name] was able to recognize that she shared appropriately.  Jaime [name] discussed her friend with cancer and shared that she had not lost control over her emotions in front of the person but later and at home - client and clinician discussed the normalcy of having strong emotions when faced with the fact that your friend is terminally ill. Client and clinician discussed how Jaime [name] could continue to use the 7 panel thought record sheets to help her challenge her negative automatic thoughts.Jaime [name] and clinician reviewed her (cbt) distress tolerance homework and she agreed to continue the homework distress tolerance focused on anxiety. Jaime [name] agreed to continue her grounding techniques and her gratitude journal. Clinician had Jaime [name] instruct the clinician on how to do the new grounding technique and client and clinician practiced together.   Plan: Return again in 1 weeks.  Diagnosis: Axis I: Major Depressive Disorder    Gracious Renken A, LCSW 10/10/2014

## 2014-10-18 ENCOUNTER — Ambulatory Visit (HOSPITAL_COMMUNITY): Payer: Self-pay | Admitting: Clinical

## 2014-10-18 ENCOUNTER — Encounter (HOSPITAL_COMMUNITY): Payer: Self-pay | Admitting: Psychiatry

## 2014-10-18 ENCOUNTER — Ambulatory Visit (INDEPENDENT_AMBULATORY_CARE_PROVIDER_SITE_OTHER): Payer: 59 | Admitting: Psychiatry

## 2014-10-18 VITALS — BP 132/82 | HR 76 | Ht 67.5 in | Wt 139.6 lb

## 2014-10-18 DIAGNOSIS — F411 Generalized anxiety disorder: Secondary | ICD-10-CM

## 2014-10-18 MED ORDER — TRAZODONE HCL 50 MG PO TABS: ORAL_TABLET | ORAL | Status: DC

## 2014-10-18 MED ORDER — ALPRAZOLAM 0.5 MG PO TABS: 0.5000 mg | ORAL_TABLET | Freq: Every evening | ORAL | Status: DC | PRN

## 2014-10-18 NOTE — Progress Notes (Signed)
[facility] Initial Assessment Note  [name] L Sitar [id number] 49 y.o.  10/18/2014 12:43 PM  Chief Complaint:  I have a lot of anxiety and nervousness.  History of Present Illness:  Patient is 49 year old Caucasian, married, self-employed female who came with her adopted mother for the management of anxiety and depressive symptoms.  Patient has history of anxiety and depression for past few years however it has been more intense and frequent since December.  Patient reported her physical symptoms started to get intensify in recent months.  She has plantar fasciitis and recently she has been in a lot of pain and could not go back to work.  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  She has been not back to work in past 6 weeks.  Recently she was also diagnosed with vaginal discharge and she believe she is going through menopause.  Her OB/GYN started her on amitriptyline but it cause worsening of anxiety and depression.  She started to have shakes and her anxiety started to get worse.  Her primary care physician is started on Zoloft which was given in the past with good response.  She is taking Zoloft 200 mg but she believe her anxiety and nervousness is getting worse since she is taking Zoloft.  She endorsed poor sleep, crying spells, feeling of hopelessness and worthlessness.  She also endorse having panic attacks and excessive worrying about her future and her health.  She admitted decreased energy, lack of motivation to do things.  However she denies any self-injurious behavior or any suicidal thoughts.  She admitted low self-esteem, anhedonia and depressed mood.  She is only sleeping 2-3 hours.  She endorse racing thoughts and believe her mind is not slow down.  She is taking Zoloft 100 mg twice a day and Xanax 0.5 mg 1-2 tablet as needed.  Patient is seeing Thelma BargeFrancis this office for counseling.  Patient denies drinking or using any illegal substances.  She denies any obsessive thoughts,  paranoia, hallucination, mania, psychosis or any hallucination.  She denies any PTSD symptoms.  Suicidal Ideation: No Plan Formed: No Patient has means to carry out plan: No  Homicidal Ideation: No Plan Formed: No Patient has means to carry out plan: No  Past Psychiatric History/Hospitalization(s) Patient denies any history of suicidal attempt or any inpatient psychiatric treatment.  She endorse history of depression for more than 15 years and she has given in the past Wellbutrin which cause weight gain and she believed given Prozac but did not help her.  Recently her OB/GYN given her amitriptyline which cause anxiety get worse.  She had a good response with Zoloft in the past.  She has never seen psychiatrist before.  Patient denies any history of mania, psychosis or any hallucination. Anxiety: Yes Bipolar Disorder: No Depression: Yes Mania: No Psychosis: No Schizophrenia: No Personality Disorder: No Hospitalization for psychiatric illness: No History of Electroconvulsive Shock Therapy: No Prior Suicide Attempts: No  Medical History; Patient has plantar fasciitis and recently vaginitis and diagnosed with menopause.  She had tried estrogen but it cause worsening of anxiety.  Her primary care physician is Dr. Cam HaiKimberly shaw.   Traumatic brain injury: Patient denies any history of traumatic brain injury.  Family History; No known family history as patient was adopted when she was only 93 days old.  Education and Work History; Patient has high school education.  She is working as a self-employed.  She does cleaning job.  Psychosocial History; Patient was raised in [place]  She was adopted when she was only 63 days old.  She is very close to her adopted mother.  Patient has no children.  She lives with her husband who she's been married for 23 years.  Legal History; Patient denies any legal issues.  History Of Abuse; Patient denies any history of abuse.  Substance  Abuse History; Patient denies any history of drinking or any illegal substance use.  Review of Systems: Psychiatric: Agitation: No Hallucination: No Depressed Mood: Yes Insomnia: No Hypersomnia: No Altered Concentration: No Feels Worthless: No Grandiose Ideas: No Belief In Special Powers: No New/Increased Substance Abuse: No Compulsions: No  Neurologic: Headache: Yes Seizure: No Paresthesias: No   Musculoskeletal: Strength & Muscle Tone: within normal limits Gait & Station: normal Patient leans: N/A   Outpatient Encounter Prescriptions as of 10/18/2014  Medication Sig  . ALPRAZolam (XANAX) 0.5 MG tablet Take 1 tablet (0.5 mg total) by mouth at bedtime as needed for anxiety.  . sertraline (ZOLOFT) 100 MG tablet Take 200 mg by mouth daily.  . traZODone (DESYREL) 50 MG tablet Take 1/2 to 1 tab at bed time  . [DISCONTINUED] ALPRAZolam (XANAX) 0.5 MG tablet Take 0.5 mg by mouth at bedtime as needed for anxiety (via Dr. Clelia Croft family Physcian).  . [DISCONTINUED] DiphenhydrAMINE HCl, Sleep, 25 MG CAPS Take 50 mg by mouth at bedtime.  . [DISCONTINUED] Melatonin 3 MG CAPS Take 2 capsules by mouth daily as needed.  . [DISCONTINUED] traZODone (DESYREL) 50 MG tablet Take 50 mg by mouth at bedtime.  . Calcium Carbonate-Vit D-Min (CALCIUM 1200 PO) Take by mouth.  . Multiple Vitamin (MULTIVITAMIN) tablet Take 1 tablet by mouth daily.  . [DISCONTINUED] amitriptyline (ELAVIL) 10 MG tablet Take 10 mg by mouth at bedtime.  . [DISCONTINUED] diclofenac (VOLTAREN) 75 MG EC tablet Take 1 tablet (75 mg total) by mouth 2 (two) times daily. (Patient not taking: Reported on 10/01/2014)  . [DISCONTINUED] lidocaine (XYLOCAINE) 5 % ointment Apply 1 application topically as needed (via Dr. Senaida Ores).  . [DISCONTINUED] triamcinolone acetonide (KENALOG) 10 MG/ML injection 10 mg   . [DISCONTINUED] triamcinolone acetonide (KENALOG) 10 MG/ML injection 10 mg     No results found for this or any previous visit  (from the past 2160 hour(s)).    Constitutional:  BP 132/82 mmHg  Pulse 76  Ht 5' 7.5" (1.715 m)  Wt 139 lb 9.6 oz (63.322 kg)  BMI 21.53 kg/m2   Mental Status Examination;  Patient is casually dressed and fairly groomed.  She appears very anxious and easily tearful.  She described her mood very nervous anxious and tense.  Her affect is constricted.  Her speech is fast but coherent.  Her thought process is fast but logical.  She denies any auditory or visual hallucination.  She denies any active or passive suicidal parts or homicidal thought.  Her attention concentration is distracted at times.  There were no delusions, paranoia or any obsessive thoughts.  Her psychomotor activity is slightly increased.  Her fund of knowledge is adequate.  She has mild tremors which could be due to increased anxiety.  Her cognition is good.  She is alert and oriented 3.  Her insight judgment and impulse control is okay.   New problem, with additional work up planned, Review of Psycho-Social Stressors (1), Review or order clinical lab tests (1), Decision to obtain old records (1), Review and summation of old records (2), Established Problem, Worsening (2), New Problem, with no additional work-up planned (3), Review of Medication  Regimen & Side Effects (2) and Review of New Medication or Change in Dosage (2)  Assessment: Axis I: Generalized anxiety disorder.    Axis II: Deferred  Axis III:  Past Medical History  Diagnosis Date  . Anxiety   . Depression   . Fatigue      Plan:  I review her symptoms, history, current medication.  Patient is taking Zoloft 100 mg twice a day and Xanax 0.5 mg as needed.  She is experiencing increased anxiety and tremors since she is taking Zoloft 100 mg twice a day.  I recommended to cut down her Zoloft and take only 1 tablet in the morning and try trazodone 50 mg half to one tablet at bedtime to help her sleep.  Discussed medication side effects and benefits.  She  mentioned that she had tried other antidepressant in the past but she do not remember.  I will get records from her primary care physician Dr. Cam Hai for more information.  Discussed medication side effects and benefits.  Recommended to call us back if she has any question or any concern.  Recommended to see Scarlette Calico for counseling.  Follow-up in 3 weeks.Time spent 55 minutes.  More than 50% of the time spent in psychoeducation, counseling and coordination of care.  Discuss safety plan that anytime having active suicidal thoughts or homicidal thoughts then patient need to call 911 or go to the local emergency room.    Mykeal Carrick T., MD 10/18/2014

## 2014-10-21 ENCOUNTER — Ambulatory Visit (HOSPITAL_COMMUNITY): Payer: Self-pay | Admitting: Clinical

## 2014-11-01 ENCOUNTER — Encounter (HOSPITAL_COMMUNITY): Payer: Self-pay | Admitting: Clinical

## 2014-11-01 ENCOUNTER — Ambulatory Visit (INDEPENDENT_AMBULATORY_CARE_PROVIDER_SITE_OTHER): Payer: 59 | Admitting: Clinical

## 2014-11-01 DIAGNOSIS — F411 Generalized anxiety disorder: Secondary | ICD-10-CM

## 2014-11-01 DIAGNOSIS — F331 Major depressive disorder, recurrent, moderate: Secondary | ICD-10-CM | POA: Diagnosis not present

## 2014-11-01 NOTE — Progress Notes (Signed)
   THERAPIST PROGRESS NOTE  Session Time: 11:05- 12:51  Participation Level: Active  Behavioral Response: Casual and NeatAlertNA  Type of Therapy: Individual Therapy  Treatment Goals addressed: Improve psychiatric symptoms, emotional regulation skills  Interventions: CBT and Motivational Interviewing  Summary: AJANEE BUREN is a 49 y.o. female who presents with Major depressive disorder, recurrent episode, moderate.   Suicidal/Homicidal: Nowithout intent/plan  Therapist Response:  Colletta Maryland met with clinician for an individual session. Olimpia discussed her psychiatric symptoms, her current life events, and her homework. Xiadani shared that she had seen the psychiatrist (Dr. Adele Schilder). She shared that she was much happier with the results from her (now adjusted) medications. She shared that she was able to work 2 days this week and believes she will be able to increase next week. Ameah shared about the support of her family. She also shared about automatic negative thoughts. Colletta Maryland and clinician discussed the process of challenging those thoughts. Colletta Maryland and clinician discussed her homework. She shared that she had completed a cbt packet on anxiety and she shared her thoughts and insights about the packet. Khaniyah shared that she continues to do her gratitude journal which reminds her to look for positive things in her day. She reported that she finds the practice helpful. She also continues to do her grounding and mindfulness practice. Shanin agreed to continue her homework until next session.  Plan: Return again in 2 weeks.  Diagnosis: Axis I: Major depressive disorder, recurrent episode, moderate        Lynea Rollison A, LCSW 11/01/2014

## 2014-11-04 ENCOUNTER — Encounter (HOSPITAL_COMMUNITY): Payer: Self-pay | Admitting: Clinical

## 2014-11-11 ENCOUNTER — Encounter (HOSPITAL_COMMUNITY): Payer: Self-pay | Admitting: Psychiatry

## 2014-11-11 ENCOUNTER — Ambulatory Visit (INDEPENDENT_AMBULATORY_CARE_PROVIDER_SITE_OTHER): Payer: 59 | Admitting: Psychiatry

## 2014-11-11 VITALS — BP 125/83 | HR 76 | Ht 63.5 in | Wt 141.2 lb

## 2014-11-11 DIAGNOSIS — F411 Generalized anxiety disorder: Secondary | ICD-10-CM

## 2014-11-11 MED ORDER — SERTRALINE HCL 100 MG PO TABS: 100.0000 mg | ORAL_TABLET | Freq: Every day | ORAL | Status: DC

## 2014-11-11 MED ORDER — TRAZODONE HCL 50 MG PO TABS: ORAL_TABLET | ORAL | Status: DC

## 2014-11-11 NOTE — Progress Notes (Signed)
[facility] [id number] Progress Note   Jaime [name] [id number] 49 y.o.  11/11/2014 11:58 AM  Chief Complaint:  I am feeling better.  I do not have to take Xanax I am sleeping better.   History of Present Illness:  Jaime [name] came for her follow-up appointment.  She was seen first time on February 19 because she was experiencing increased anxiety and depression.  She is having trouble sleeping and recently changes in her antidepressant from her OB/GYN did not help her.  We had cut down her Zoloft from 200 mg to 100 mg and started trazodone 50 mg at bedtime.  Her sleep is improved from the past.  She denies any crying spells, feeling of hopelessness or any irritability.  She denies any major panic attack.  She has not taking Xanax however she liked to keep it for emergency basis.  She is back to work and she is very happy about it.  She has plantar fasciitis and recently has been not going to work because of pain.  She started seeing Jaime [name] for counseling and she is happy about it.  Her energy level is good.  She denies any side effects of medication.  Patient denies any mania, psychosis or any hallucination.  We are still reading collateral information from her physician Dr. Cam [name].  Patient denies drinking or using any illegal substances.  Her appetite is okay.  Her vitals are stable.  Patient lives with her husband and she's been married for 23 years.  Patient is self-employed and she clean people house.  Suicidal Ideation: No Plan Formed: No Patient has means to carry out plan: No  Homicidal Ideation: No Plan Formed: No Patient has means to carry out plan: No  Past Psychiatric History/Hospitalization(s) Patient denies any history of suicidal attempt or any inpatient psychiatric treatment.  She endorse history of depression for more than 15 years and she has given in the past Wellbutrin which cause weight gain and she believed given Prozac but did not help her.  Recently her  OB/GYN given her amitriptyline which cause anxiety get worse.  She had a good response with Zoloft in the past.  She has never seen psychiatrist before.  Patient denies any history of mania, psychosis or any hallucination. Anxiety: Yes Bipolar Disorder: No Depression: Yes Mania: No Psychosis: No Schizophrenia: No Personality Disorder: No Hospitalization for psychiatric illness: No History of Electroconvulsive Shock Therapy: No Prior Suicide Attempts: No  Medical History; Patient has plantar fasciitis and recently vaginitis and diagnosed with menopause.  She had tried estrogen but it cause worsening of anxiety.  Her primary care physician is Dr. Cam [name].   Review of Systems: Psychiatric: Agitation: No Hallucination: No Depressed Mood: No Insomnia: No Hypersomnia: No Altered Concentration: No Feels Worthless: No Grandiose Ideas: No Belief In Special Powers: No New/Increased Substance Abuse: No Compulsions: No  Neurologic: Headache: Yes Seizure: No Paresthesias: No   Musculoskeletal: Strength & Muscle Tone: within normal limits Gait & Station: normal Patient leans: N/A   Outpatient Encounter Prescriptions as of 11/11/2014  Medication Sig  . Calcium Carbonate-Vit D-Min (CALCIUM 1200 PO) Take by mouth.  . Multiple Vitamin (MULTIVITAMIN) tablet Take 1 tablet by mouth daily.  . sertraline (ZOLOFT) 100 MG tablet Take 1 tablet (100 mg total) by mouth daily.  . traZODone (DESYREL) 50 MG tablet Take 1/2 to 1 tab at bed time  . [DISCONTINUED] sertraline (ZOLOFT) 100 MG tablet Take 200 mg by mouth daily.  . [DISCONTINUED] traZODone (DESYREL)  50 MG tablet Take 1/2 to 1 tab at bed time  . ALPRAZolam (XANAX) 0.5 MG tablet Take 1 tablet (0.5 mg total) by mouth at bedtime as needed for anxiety. (Patient not taking: Reported on 11/11/2014)    No results found for this or any previous visit (from the past 2160 hour(s)).    Constitutional:  BP 125/83 mmHg  Pulse 76  Ht 5'  3.5" (1.613 m)  Wt 141 lb 3.2 oz (64.048 kg)  BMI 24.62 kg/m2   Mental Status Examination;  Patient is casually dressed and fairly groomed.  She is pleasant and cooperative.  She described her mood good and her affect is improved from the past. Her speech is fast but coherent.  Her thought process is fast but logical.  She denies any auditory or visual hallucination.  She denies any active or passive suicidal parts or homicidal thought.  Her attention concentration is distracted at times.  There were no delusions, paranoia or any obsessive thoughts.  Her psychomotor activity is slightly increased.  Her fund of knowledge is adequate.  Her cognition is good.  She is alert and oriented 3.  Her insight judgment and impulse control is okay.   Established Problem, Stable/Improving (1), Review of Psycho-Social Stressors (1), Review or order clinical lab tests (1), Decision to obtain old records (1), Review of Last Therapy Session (1), Review of Medication Regimen & Side Effects (2) and Review of New Medication or Change in Dosage (2)  Assessment: Axis I: Generalized anxiety disorder.    Axis II: Deferred  Axis III:  Past Medical History  Diagnosis Date  . Anxiety   . Depression   . Fatigue      Plan:  Patient is doing better on trazodone 50 mg at bedtime and Zoloft 100 mg daily.  She has not used Xanax since the last visit however she liked to keep the Xanax or emergency.  She is seeing therapist for coping skills.  We have not received collateral information from her primary care physician and we will contact again since she had recently blood work.  Discussed medication side effects and benefits.  Recommended to call us back if she has any question or any concern.  I will see her again in 2 months.  I encouraged to keep appointment with therapist for coping skills. Time spent 25 minutes.  More than 50% of the time spent in psychoeducation, counseling and coordination of care.  Discuss safety  plan that anytime having active suicidal thoughts or homicidal thoughts then patient need to call 911 or go to the local emergency room.    ARFEEN,SYED T., MD 11/11/2014

## 2014-11-15 ENCOUNTER — Ambulatory Visit (HOSPITAL_COMMUNITY): Payer: Self-pay | Admitting: Clinical

## 2014-11-19 ENCOUNTER — Ambulatory Visit (HOSPITAL_COMMUNITY): Payer: Self-pay | Admitting: Clinical

## 2014-11-20 ENCOUNTER — Telehealth (HOSPITAL_COMMUNITY): Payer: Self-pay | Admitting: *Deleted

## 2014-11-20 NOTE — Telephone Encounter (Signed)
She can take trazodone 100 mg.

## 2014-11-20 NOTE — Telephone Encounter (Signed)
Dr. Lolly MustacheArfeen,   Patient called and left message on nurse's voice mail.  Patient stated that she is not sleeping well and would like an increase of her Trazodone.  Patient stated that she is at 50 mg request to increase to 100 mg.  Please advise.   Thank you

## 2014-11-21 NOTE — Telephone Encounter (Signed)
I called patient back.  I advised patient that per Dr. Lolly MustacheArfeen patient can increase her Trazodone to 100 mg at bedtime.  Patient stated that she did take 2 tablets at bedtime last night and slept better.  Patient stated that she just does not feel well, due to the flu. Patient stated that she was having the nausea, diarrhea, stomach pains, and weakness.  Patient stated on Tuesday all the diarrhea, nausea, and stomach pains stopped.  Patient stated she still feels shaky but feels like she is getting better. Patient stated that she thinks lack of sleep made her flu symptoms worse. Patient stated that she has not been able to work due to flu symptoms and lack of sleep.  Patient stated she does not know what she should do about her job. Patient ask if Dr. Lolly MustacheArfeen thinks she should just not work.  I advised patient that Dr. Lolly MustacheArfeen cleared for patient to increase her Trazodone to 100 mg to help resolve sleep issues. I advised patient that Dr. Lolly MustacheArfeen does not make the decision about her job, that is her choice.  Patient stated that she thinks she will just quit her job, getting harder to work.  I advised patient that will be her decision. I advised patient to continue to take Trazodone 100 mg at bedtime, that way sleep will hopefully stay good.  I advised patient to drink plenty of fluids, chicken noodle soup, crackers, and dry toast.  Patient stated she has been drinking Gatorade, Ginger ale, eating dry toast, and eating chicken noodle soup.  I advised patient if flu symptoms are not better by next week then she will need to contact her PCP or go to an ER or Urgent Care this weekend--depending on how she feels.  Patient stated she feels like she is getting better.  I advised patient to call us on Monday to let us know how she is doing and if sleep is getting better.  I advised patient to go to the ER or Urgent Care if flu symptoms are not better. I advised patient since Trazodone was increased she may  be out before her next appointment so call us if she needs more.  Patient verbalized understanding, will call Monday.   *Patient has no thoughts of hurting herself or anyone else.

## 2014-11-25 ENCOUNTER — Telehealth (HOSPITAL_COMMUNITY): Payer: Self-pay | Admitting: *Deleted

## 2014-11-25 NOTE — Telephone Encounter (Signed)
Dr. Lolly MustacheArfeen,   Patient called.  Patient stated that she was instructed to increase her Trazodone to 100 mg.  Patient stated that she is sleeping better but every morning she has diarrhea.  Patient stated that she read about Trazodone and that is a side effect.  Patient stated that she just wants to know if there is something else you can give her.  Patient stated that she takes Pepto and that stops the diarrhea.  Please advise.  Thank you  *Patient aware you will be in the office tomorrow, out today.

## 2014-11-26 NOTE — Telephone Encounter (Signed)
I returned phone call.  She is feeling better and does not have any diarrhea.  She wants to continue trazodone 100 mg at bedtime.

## 2014-12-02 ENCOUNTER — Other Ambulatory Visit (HOSPITAL_COMMUNITY): Payer: Self-pay

## 2014-12-02 ENCOUNTER — Telehealth (HOSPITAL_COMMUNITY): Payer: Self-pay

## 2014-12-02 DIAGNOSIS — F411 Generalized anxiety disorder: Secondary | ICD-10-CM

## 2014-12-02 MED ORDER — SERTRALINE HCL 100 MG PO TABS: 100.0000 mg | ORAL_TABLET | Freq: Every day | ORAL | Status: DC

## 2014-12-02 MED ORDER — TRAZODONE HCL 100 MG PO TABS: 100.0000 mg | ORAL_TABLET | Freq: Every day | ORAL | Status: DC

## 2014-12-02 NOTE — Telephone Encounter (Signed)
Medication refill request for patient's changed dosage of Trazodone and also a needed new order for patient's Zoloft.  Patient [facility] pharmacy and [facility] is telling Tawanna Coolerodd, pharmacist at [facility] they have no further orders nor the new Trazodone order to transfer to their pharmacy.  Todd, pharmacist states patient should be out of Trazodone due to increase and will need a new Zoloft order soon.  Patient's next evaluation 01/13/15.

## 2014-12-02 NOTE — Telephone Encounter (Signed)
Met with Dr. Arfeen who authorized new orders for patient to be e-scribed into [facility] today.  Dr. Arfeen authorized Trazodone 100mg, 1 at bedtime, #30 with one refill and Zoloft 100mg, 1 at bedtime, #30 with one refill to last patient until appointment 01/13/15.  Called Todd, pharmacist back at [facility] to make sure both orders received and patient to call back if any further problems getting orders. Called patient to inform new orders were e-scribed to [facility] and patient agreed to call back if any more difficulties getting refills.  Will keep appointment on 01/13/15. 

## 2014-12-02 NOTE — Telephone Encounter (Signed)
Telephone call back to [facility] after Tawanna Coolerodd, pharmacist had left a message requesting new orders for patient's Trazodone and Zoloft.  Informed [facility] [address] on 11/11/14 at 11:50am and requested they call [facility] per patient's request to them.  Requested they call back if any problems getting orders transferred over.

## 2014-12-05 ENCOUNTER — Ambulatory Visit (INDEPENDENT_AMBULATORY_CARE_PROVIDER_SITE_OTHER): Payer: 59 | Admitting: Clinical

## 2014-12-05 DIAGNOSIS — F411 Generalized anxiety disorder: Secondary | ICD-10-CM

## 2014-12-05 DIAGNOSIS — F331 Major depressive disorder, recurrent, moderate: Secondary | ICD-10-CM | POA: Diagnosis not present

## 2014-12-09 ENCOUNTER — Encounter (HOSPITAL_COMMUNITY): Payer: Self-pay | Admitting: Clinical

## 2014-12-09 NOTE — Progress Notes (Signed)
   THERAPIST PROGRESS NOTE  Session Time: 3:30 - 4:30  Participation Level: Active  Behavioral Response: Casual and NeatAlertNA  Type of Therapy: Individual Therapy  Treatment Goals addressed: improve psychiatric symptoms, emotional regulation skills,   Interventions: CBT, Motivational Interviewing and Other: mindfulness and grounding techniques  Summary: Jaime [name] is a 49 y.o. female who presents with Major Depressive Disorder.   Suicidal/Homicidal: Nowithout intent/plan  Therapist Response:  Colletta Maryland met with clinician for an individual session. Jaime [name] discussed her psychiatric symptoms, her current life events, and her homework. Jaime [name] shared that her psychiatric symptoms have greatly improved with her medications and skills practice. She shared that she has been more able to work and take care of her daily tasks. Jaime [name] shared that she continues to practice her cbt and her grounding and mindfulness techniques. She shared that she continues to keep her gratitude journal and is finding it it easier to recognize the good stuff in her life. Colletta Maryland and clinician agreed that Jaime [name] is making great progress. Colletta Maryland and clinician agreed to meet again in one month to check her progress and strengthen skills. Jaime [name] is aware that she could make a earlier appointment if needed.  Client and clinician agreed on the homework that Jaime [name] would complete before next session.  Plan: Return again in 1 Month.  Diagnosis:     Axis I: Major Depressive Disorder    Desta Bujak A, LCSW 12/09/2014

## 2015-01-02 ENCOUNTER — Encounter (HOSPITAL_COMMUNITY): Payer: Self-pay | Admitting: Clinical

## 2015-01-02 ENCOUNTER — Ambulatory Visit (INDEPENDENT_AMBULATORY_CARE_PROVIDER_SITE_OTHER): Payer: 59 | Admitting: Clinical

## 2015-01-02 DIAGNOSIS — F331 Major depressive disorder, recurrent, moderate: Secondary | ICD-10-CM | POA: Diagnosis not present

## 2015-01-02 DIAGNOSIS — F411 Generalized anxiety disorder: Secondary | ICD-10-CM

## 2015-01-02 NOTE — Progress Notes (Signed)
   THERAPIST PROGRESS NOTE  Session Time: 4:33 -5:30  Participation Level: Active  Behavioral Response: CasualAlertNA  Type of Therapy: Individual Therapy  Treatment Goals addressed: improve psychiatric symptoms, emotional regulation skills, Distress tolerance Skills, calming skills   Interventions: CBT, Motivational Interviewing, Mindfulness and Grounding Techniques  Summary: Jaime [name] is a 49 y.o. female who presents with Major Depressive Disorder.   Suicidal/Homicidal: No -without intent/plan  Therapist Response:  Jaime [name] met with clinician for an individual session. Jaime [name] discussed her psychiatric symptoms, her current life events, and her homework. Jaime [name] shared that she continues to do her homework. She shared that she noticed herself slacking and that life was more difficult for her than it needed to be. Jaime [name] shared that since then she has returned to her practice. Jaime [name] discussed her grounding techniques and client and clinician discussed ways to continue to improve them. Jaime [name] reviewed her cbt packet. She shared her thoughts and insights from the packet. Jaime [name] shared that some days are still difficult but her homework helps her keep her thoughts in balance. Jaime [name] and clinician discussed her gratitude journal, she shared that the process is helping her to see more positive things in the world. Jaime [name] agreed to continue her homework until next session.   Plan: Return again in 1 Month.  Diagnosis:     Axis I: Major Depressive Disorder    Belal Scallon A, LCSW 01/02/2015

## 2015-01-13 ENCOUNTER — Ambulatory Visit (HOSPITAL_COMMUNITY): Payer: Self-pay | Admitting: Psychiatry

## 2015-01-16 ENCOUNTER — Ambulatory Visit (INDEPENDENT_AMBULATORY_CARE_PROVIDER_SITE_OTHER): Payer: 59 | Admitting: Psychiatry

## 2015-01-16 ENCOUNTER — Encounter (HOSPITAL_COMMUNITY): Payer: Self-pay | Admitting: Psychiatry

## 2015-01-16 ENCOUNTER — Ambulatory Visit (INDEPENDENT_AMBULATORY_CARE_PROVIDER_SITE_OTHER): Payer: 59 | Admitting: Certified Nurse Midwife

## 2015-01-16 ENCOUNTER — Encounter: Payer: Self-pay | Admitting: Certified Nurse Midwife

## 2015-01-16 VITALS — BP 128/79 | HR 66 | Ht 67.0 in | Wt 141.0 lb

## 2015-01-16 VITALS — BP 120/60 | HR 64 | Resp 16 | Ht 66.5 in | Wt 140.0 lb

## 2015-01-16 DIAGNOSIS — F411 Generalized anxiety disorder: Secondary | ICD-10-CM | POA: Diagnosis not present

## 2015-01-16 DIAGNOSIS — Z124 Encounter for screening for malignant neoplasm of cervix: Secondary | ICD-10-CM | POA: Diagnosis not present

## 2015-01-16 DIAGNOSIS — Z01419 Encounter for gynecological examination (general) (routine) without abnormal findings: Secondary | ICD-10-CM | POA: Diagnosis not present

## 2015-01-16 MED ORDER — SERTRALINE HCL 100 MG PO TABS: 100.0000 mg | ORAL_TABLET | Freq: Every day | ORAL | Status: DC

## 2015-01-16 MED ORDER — TRAZODONE HCL 100 MG PO TABS: 100.0000 mg | ORAL_TABLET | Freq: Every day | ORAL | Status: DC

## 2015-01-16 NOTE — Psych (Signed)
BH MD/PA/NP OP Progress Note  01/16/2015 4:23 PM Jaime MeekStephanie L [name]  MRN:  [id number]  Subjective:  Patient came for her follow-up appointment.  She is taking trazodone 100 mg and Zoloft 100 mg daily.  She tried initially trazodone 50 but she continues to have insomnia however she was recommended to take trazodone 100 mg and she is feeling much better.  She denies any crying spells, irritability, feeling of hopelessness or worthlessness.  She denies any major panic attack and her anxiety is well controlled.  She recently seen her new physician Dr. Darcel BayleyLeonard and she is very happy about it.  She is no longer taking Xanax.  Her energy level is good.  She denies any mania, psychosis, hallucination.  Her appetite is okay.  Her vitals are stable.  Patient lives with her husband and she's been married for 23 years.  She is self-employed and she claimed people house.  Chief Complaint:  Visit Diagnosis:     ICD-9-CM ICD-10-CM   1. GAD (generalized anxiety disorder) 300.02 F41.1 traZODone (DESYREL) 100 MG tablet     sertraline (ZOLOFT) 100 MG tablet    Past Medical History:  Past Medical History  Diagnosis Date  . Anxiety   . Depression   . Fatigue   . Abnormal Pap smear of cervix ~2006    Past Surgical History  Procedure Laterality Date  . Colposcopy  ~ 2006    Normal - per pt   Family History:  Family History  Problem Relation Age of Onset  . Adopted: Yes  . Family history unknown: Yes   Social History:  History   Social History  . Marital Status: Married    Spouse Name: N/A  . Number of Children: N/A  . Years of Education: N/A   Social History Main Topics  . Smoking status: Former Games developermoker  . Smokeless tobacco: Never Used  . Alcohol Use: No  . Drug Use: No  . Sexual Activity: No   Other Topics Concern  . Not on file   Social History Narrative   Additional History: As above  Assessment:   Musculoskeletal: Strength & Muscle Tone: within normal limits Gait & Station:  normal Patient leans: N/A  Psychiatric Specialty Exam: HPI  ROS  Blood pressure 128/79, pulse 66, height 5\' 7"  (1.702 m), weight 141 lb (63.957 kg).Body mass index is 22.08 kg/(m^2).  General Appearance: Casual  Eye Contact:  Good  Speech:  Normal Rate  Volume:  Normal  Mood:  Euthymic  Affect:  Appropriate  Thought Process:  Logical  Orientation:  Full (Time, Place, and Person)  Thought Content:  WDL  Suicidal Thoughts:  No  Homicidal Thoughts:  No  Memory:  Immediate;   Good Recent;   Good Remote;   Good  Judgement:  Intact  Insight:  Good  Psychomotor Activity:  Normal  Concentration:  Good  Recall:  Good  Fund of Knowledge: Good  Language: Good  Akathisia:  No  Handed:  Right  AIMS (if indicated):  Not indicated   Assets:  Communication Skills Desire for Improvement Financial Resources/Insurance Housing Social Support  ADL's:  Intact  Cognition: WNL  Sleep:  Good    Is the patient at risk to self?  No. Has the patient been a risk to self in the past 6 months?  No. Has the patient been a risk to self within the distant past?  No. Is the patient a risk to others?  No. Has the patient been a risk  to others in the past 6 months?  No. Has the patient been a risk to others within the distant past?  No.  Current Medications: Current Outpatient Prescriptions  Medication Sig Dispense Refill  . Calcium Carbonate-Vit D-Min (CALCIUM 1200 PO) Take by mouth.    . Multiple Vitamin (MULTIVITAMIN) tablet Take 1 tablet by mouth daily.    . sertraline (ZOLOFT) 100 MG tablet Take 1 tablet (100 mg total) by mouth daily. 30 tablet 2  . traZODone (DESYREL) 100 MG tablet Take 1 tablet (100 mg total) by mouth at bedtime. 30 tablet 2   No current facility-administered medications for this visit.    Medical Decision Making:  Established Problem, Stable/Improving (1), Review of Last Therapy Session (1) and Review of Medication Regimen & Side Effects (2)  Treatment Plan  Summary:Medication management  Patient is doing better on trazodone 100 mg at bedtime and Zoloft 100 mg daily.  I will discontinue Xanax since she has not taken Xanax in 3 months.  She is seeing Thelma BargeFrancis for coping and social skills.  Recommended to continue her current medication.  Discussed medication side effects and benefits.  Recommended to call us back if she has any question or any concern.  Follow-up in 3 months.   Darrie Macmillan T. 01/16/2015, 4:23 PM

## 2015-01-16 NOTE — Progress Notes (Signed)
Reviewed personally.  M. Suzanne Anabel Lykins, MD.  

## 2015-01-16 NOTE — Progress Notes (Signed)
49 y.o. G0P0000 Married  Caucasian Fe here to establish gyn care and for annual exam. Patient currently seeing counselor Elana AlmFrankie Powell and Dr. Karsten Roafene, for anxiety disorder and stable with medication. Patient had significant weigh loss prior to treatment.. Patient having vaginal dryness, but was treated, feeling much better with coconut oil use. She was referred here by another patient and "is happy to be here today". Sees Dr Clelia CroftShaw as PCP/labs, recent all normal. Patient was adopted and does not have any health information regarding. No health concerns today.  No LMP recorded. Patient is not currently having periods (Reason: Perimenopausal).   LMP 4/14       Sexually active: No.  The current method of family planning is abstinence.    Exercising: Yes.    Walking 3 - 4 x daily  Smoker:  no  Health Maintenance: Pap:  10/2012 Neg. HR HPV:Neg MMG:  07/2013 BIRADS1:Neg Density D breast Self Breast Exam: yes, Occ Colonoscopy:  Never. PCP Scheduling  BMD:   Never TDaP:  2015 approximate Labs: PCP   reports that she has quit smoking. She has never used smokeless tobacco. She reports that she does not drink alcohol or use illicit drugs.  Past Medical History  Diagnosis Date  . Anxiety   . Depression   . Fatigue   . Abnormal Pap smear of cervix ~2006    Past Surgical History  Procedure Laterality Date  . Colposcopy  ~ 2006    Normal - per pt    Current Outpatient Prescriptions  Medication Sig Dispense Refill  . Calcium Carbonate-Vit D-Min (CALCIUM 1200 PO) Take by mouth.    . Multiple Vitamin (MULTIVITAMIN) tablet Take 1 tablet by mouth daily.    . sertraline (ZOLOFT) 100 MG tablet Take 1 tablet (100 mg total) by mouth daily. 30 tablet 1  . traZODone (DESYREL) 100 MG tablet Take 1 tablet (100 mg total) by mouth at bedtime. 30 tablet 1  . ALPRAZolam (XANAX) 0.5 MG tablet Take 1 tablet (0.5 mg total) by mouth at bedtime as needed for anxiety. (Patient not taking: Reported on 11/11/2014) 30  tablet 0   No current facility-administered medications for this visit.    Family History  Problem Relation Age of Onset  . Adopted: Yes  . Family history unknown: Yes    ROS:  Pertinent items are noted in HPI.  Otherwise, a comprehensive ROS was negative.  Exam:   BP 120/60 mmHg  Pulse 64  Resp 16  Ht 5' 6.5" (1.689 m)  Wt 140 lb (63.504 kg)  BMI 22.26 kg/m2 Height: 5' 6.5" (168.9 cm) Ht Readings from Last 3 Encounters:  01/16/15 5' 6.5" (1.689 m)  11/11/14 5' 3.5" (1.613 m)  10/18/14 5' 7.5" (1.715 m)    General appearance: alert, cooperative and appears stated age Head: Normocephalic, without obvious abnormality, atraumatic Neck: no adenopathy, supple, symmetrical, trachea midline and thyroid normal to inspection and palpation Lungs: clear to auscultation bilaterally Breasts: normal appearance, no masses or tenderness, No nipple retraction or dimpling, No nipple discharge or bleeding, No axillary or supraclavicular adenopathy Heart: regular rate and rhythm Abdomen: soft, non-tender; no masses,  no organomegaly Extremities: extremities normal, atraumatic, no cyanosis or edema Skin: Skin color, texture, turgor normal. No rashes or lesions Lymph nodes: Cervical, supraclavicular, and axillary nodes normal. No abnormal inguinal nodes palpated Neurologic: Grossly normal   Pelvic: External genitalia:  no lesions              Urethra:  normal appearing  urethra with no masses, tenderness or lesions              Bartholin's and Skene's: normal                 Vagina: normal appearing vagina with normal color and discharge, no lesions              Cervix: normal, non tender,no lesions              Pap taken: Yes.   Bimanual Exam:  Uterus:  normal size, contour, position, consistency, mobility, non-tender              Adnexa: normal adnexa and no mass, fullness, tenderness               Rectovaginal: Confirms               Anus:  normal sphincter tone, no lesions  Chaperone  present: Yes  A:  Well Woman with normal exam  Post Menopausal no HRT  Vaginal dryness using coconut oil  Generalized Anxiety Disorder with MD management stable per patient    P:   Reviewed health and wellness pertinent to exam  Discussed importance to advise if vaginal bleeding.  Discussed needs to increase coconut oil use to nightly to increase moisture.recheck in 2 months or as needed.  Continue follow up with MD as indicated.  Pap smear taken today with HPVHR   counseled on breast self exam, mammography screening, adequate intake of calcium and vitamin D, diet and exercise  return annually or prn  An After Visit Summary was printed and given to the patient.

## 2015-01-16 NOTE — Patient Instructions (Signed)

## 2015-01-21 LAB — IPS PAP TEST WITH HPV

## 2015-01-24 ENCOUNTER — Other Ambulatory Visit: Payer: Self-pay

## 2015-01-24 DIAGNOSIS — Z1231 Encounter for screening mammogram for malignant neoplasm of breast: Secondary | ICD-10-CM

## 2015-01-29 ENCOUNTER — Ambulatory Visit (HOSPITAL_COMMUNITY): Payer: Self-pay | Admitting: Clinical

## 2015-02-20 ENCOUNTER — Encounter (HOSPITAL_COMMUNITY): Payer: Self-pay | Admitting: Clinical

## 2015-02-20 ENCOUNTER — Ambulatory Visit (INDEPENDENT_AMBULATORY_CARE_PROVIDER_SITE_OTHER): Payer: 59 | Admitting: Clinical

## 2015-02-20 DIAGNOSIS — F331 Major depressive disorder, recurrent, moderate: Secondary | ICD-10-CM | POA: Diagnosis not present

## 2015-02-20 DIAGNOSIS — F411 Generalized anxiety disorder: Secondary | ICD-10-CM

## 2015-02-20 NOTE — Progress Notes (Signed)
   THERAPIST PROGRESS NOTE  Session Time: 4:33 - 5:12  Participation Level: Active  Behavioral Response: CasualAlertHappy  Type of Therapy: Individual Therapy  Treatment Goals addressed: improve psychiatric symptoms, emotional regulation skills, Distress tolerance Skills, calming skills   Interventions:  Motivational Interviewing  Summary: Jaime [name] is a 49 y.o. female who presents with Major Depressive Disorder.   Suicidal/Homicidal: No -without intent/plan  Therapist Response:  Jaime [name] met with clinician for an individual session. Jaime [name] discussed her psychiatric symptoms and her homework. Jaime [name] shared that she continues to practice her techniques and review her homework. She shared that she is feeling much better these days. Jaime [name] shared how she applies her skills and how it (along with medication) has affected her life. Jaime [name] shared about patterns she has changed in herself. She shared ways that she has helped other people by teaching them her skills. Jaime [name] and clinician agreed that Jaime [name] is ready to graduate from therapy. Jaime [name] and clinician discussed her options should she need therapy again.Jaime [name] shared her gratitude and joy. She reported that she felt more confident and happy. She reported feeling very proud of her accomplishment - which she most rightfully should be.   Plan: Return again in 1 Month.  Diagnosis:     Axis I: Major Depressive Disorder    Meiko Ives A, LCSW 02/20/2015

## 2015-02-21 ENCOUNTER — Ambulatory Visit
Admission: RE | Admit: 2015-02-21 | Discharge: 2015-02-21 | Disposition: A | Discharge status: Home / Self Care | Payer: 59 | Source: Ambulatory Visit

## 2015-02-21 DIAGNOSIS — Z1231 Encounter for screening mammogram for malignant neoplasm of breast: Secondary | ICD-10-CM

## 2015-02-24 ENCOUNTER — Telehealth (HOSPITAL_COMMUNITY): Payer: Self-pay

## 2015-02-24 NOTE — Telephone Encounter (Signed)
02/24/15 3pm Per Provider Frankie-therapist the patient has graduated no need for other appts/sh

## 2015-03-18 ENCOUNTER — Ambulatory Visit (INDEPENDENT_AMBULATORY_CARE_PROVIDER_SITE_OTHER): Payer: 59 | Admitting: Certified Nurse Midwife

## 2015-03-18 ENCOUNTER — Encounter: Payer: Self-pay | Admitting: Certified Nurse Midwife

## 2015-03-18 VITALS — BP 100/64 | HR 68 | Resp 16 | Ht 66.5 in | Wt 142.0 lb

## 2015-03-18 DIAGNOSIS — L298 Other pruritus: Secondary | ICD-10-CM

## 2015-03-18 DIAGNOSIS — N952 Postmenopausal atrophic vaginitis: Secondary | ICD-10-CM

## 2015-03-18 DIAGNOSIS — N898 Other specified noninflammatory disorders of vagina: Secondary | ICD-10-CM

## 2015-03-18 NOTE — Progress Notes (Signed)
49 y.o. Married Caucasian female g0p0 here for follow up for vaginal dryness noted on 01/16/15 treated with coconut oil daily, prn use. Patient using as directed. Patient now complaining of vaginal irritation and itching with dryness. Using coconut oil 3 times daily with only minimal relief. Not sexually active at present. Denies any STD concerns. Spouse very supportive of problem. Denies any new personal products or vaginal odor or discharge. "Just feels dry" Denies any UTI symptoms. No other health concerns today.  O: Healthy WD,WN female Affect: normal , orientation x 3 Skin:warm and dry Abdomen:non tender Pelvic exam:Vulva: thin appearance, slightly pink, slightly tender, no lesions VAGINA: no abnormal discharge or lesions and atrophic appearance, pale, scant moisture, tender, affirm taken CERVIX: no lesions or cervical motion tenderness and very anterior UTERUS: normal,non tender, retroverted ADNEXA: no masses palpable and nontender  A:Atrophic vaginitis Menopausal no HRT Vaginal itching Normal pelvic exam    P: Discussed findings of atrophic vaginitis related to menopause and etiology. Discussed estrogen use again, has used Estrace cream with no change. Discussed other options other than cream, such as Vagifem and Estring. Discussed risks and benefits. Questions addressed. Patient will consider. Instructed to stop Coconut oil and use Aveeno bath for comfort. Can also use Aveeno Eczema cream prn itching, until Affirm results in. Increase water intake also. Avoid moist clothes for any long period which will increase the discomfort. Will treat as indicated with Affirm results. If no pathogens will proceed if patient agreeable with estrogen use vaginally.  Labs Affirm  RV prn

## 2015-03-18 NOTE — Patient Instructions (Signed)
Atrophic Vaginitis Atrophic vaginitis is a problem of low levels of estrogen in women. This problem can happen at any age. It is most common in women who have gone through menopause ("the change").  HOW WILL I KNOW IF I HAVE THIS PROBLEM? You may have:  Trouble with peeing (urinating), such as:  Going to the bathroom often.  A hard time holding your pee until you reach a bathroom.  Leaking pee.  Having pain when you pee.  Itching or a burning feeling.  Vaginal bleeding and spotting.  Pain during sex.  Dryness of the vagina.  A yellow, bad-smelling fluid (discharge) coming from the vagina. HOW WILL MY DOCTOR CHECK FOR THIS PROBLEM?  During your exam, your doctor will likely find the problem.  If there is a vaginal fluid, it may be checked for infection. HOW WILL THIS PROBLEM BE TREATED? Keep the vulvar skin as clean as possible. Moisturizers and lubricants can help with some of the symptoms. Estrogen replacement can help. There are 2 ways to take estrogen:  Systemic estrogen gets estrogen to your whole body. It takes many weeks or months before the symptoms get better.  You take an estrogen pill.  You use a skin patch. This is a patch that you put on your skin.  If you still have your uterus, your doctor may ask you to take a hormone. Talk to your doctor about the right medicine for you.  Estrogen cream.  This puts estrogen only at the part of your body where you apply it. The cream is put into the vagina or put on the vulvar skin. For some women, estrogen cream works faster than pills or the patch. CAN ALL WOMEN WITH THIS PROBLEM USE ESTROGEN? No. Women with certain types of cancer, liver problems, or problems with blood clots should not take estrogen. Your doctor can help you decide the best treatment for your symptoms. Document Released: 02/02/2008 Document Revised: 08/21/2013 Document Reviewed: 02/02/2008 [facility] Patient Information 2015 [place] [place]. This  information is not intended to replace advice given to you by your health care provider. Make sure you discuss any questions you have with your health care provider.   Aveeno bath and Aveeno Eczema cream as we discussed. Have a good day.

## 2015-03-18 NOTE — Progress Notes (Signed)
Reviewed personally.  M. Suzanne Jvion Turgeon, MD.  

## 2015-03-19 ENCOUNTER — Other Ambulatory Visit: Payer: Self-pay | Admitting: Certified Nurse Midwife

## 2015-03-19 DIAGNOSIS — N952 Postmenopausal atrophic vaginitis: Secondary | ICD-10-CM

## 2015-03-19 LAB — WET PREP BY MOLECULAR PROBE
Candida species: NEGATIVE
GARDNERELLA VAGINALIS: NEGATIVE
Trichomonas vaginosis: NEGATIVE

## 2015-03-19 MED ORDER — ESTRADIOL 10 MCG VA TABS: ORAL_TABLET | VAGINAL | Status: DC

## 2015-03-21 ENCOUNTER — Ambulatory Visit (INDEPENDENT_AMBULATORY_CARE_PROVIDER_SITE_OTHER): Payer: 59 | Admitting: Psychiatry

## 2015-03-21 ENCOUNTER — Encounter (HOSPITAL_COMMUNITY): Payer: Self-pay | Admitting: Psychiatry

## 2015-03-21 VITALS — BP 105/68 | HR 67 | Ht 67.0 in | Wt 142.0 lb

## 2015-03-21 DIAGNOSIS — F411 Generalized anxiety disorder: Secondary | ICD-10-CM

## 2015-03-21 MED ORDER — TRAZODONE HCL 100 MG PO TABS: 100.0000 mg | ORAL_TABLET | Freq: Every day | ORAL | Status: DC

## 2015-03-21 MED ORDER — SERTRALINE HCL 100 MG PO TABS: 100.0000 mg | ORAL_TABLET | Freq: Every day | ORAL | Status: DC

## 2015-03-21 NOTE — Progress Notes (Signed)
[facility] [address] Progress Note   Jaime [name] [id number] 49 y.o.  03/21/2015 9:03 AM  Chief Complaint:  Medication management and follow-up.     History of Present Illness:  Jaime [name] came for her follow-up appointment.  She is taking trazodone 100 mg which was increased last month because she could not sleep with 50 mg.  She sleeping much better with 100 mg .  She denies any major panic attack and belief combination of Zoloft and trazodone is working very well.  She has no tremors, shakes or any side effects.  Her foot pain is also under control.  She has plantar fasciitis.  She enjoys working.  She denies any irritability, anger, crying spells or any feeling of hopelessness.  Her energy level is good.  She is social active and denies any feeling of social isolation.  She stopped seeing Thelma Barge because she felt she does not need it.  Patient denies drinking or using any illegal substances.  Her appetite is good.  Her vitals are stable.  Patient lives with her husband and she's been married for 23 years.  Patient is self-employed and she clean people house.  Suicidal Ideation: No Plan Formed: No Patient has means to carry out plan: No  Homicidal Ideation: No Plan Formed: No Patient has means to carry out plan: No  Past Psychiatric History/Hospitalization(s) Patient denies any history of suicidal attempt or any inpatient psychiatric treatment.  She endorse history of depression for more than 15 years and she has given in the past Wellbutrin which cause weight gain and she believed given Prozac but did not help her.  Recently her OB/GYN given her amitriptyline which cause anxiety get worse.  She had a good response with Zoloft in the past.  She has never seen psychiatrist before.  Patient denies any history of mania, psychosis or any hallucination. Anxiety: Yes Bipolar Disorder: No Depression: Yes Mania: No Psychosis: No Schizophrenia: No Personality Disorder:  No Hospitalization for psychiatric illness: No History of Electroconvulsive Shock Therapy: No Prior Suicide Attempts: No  Medical History; Patient has plantar fasciitis and recently vaginitis and diagnosed with menopause.  She had tried estrogen but it cause worsening of anxiety.  Her primary care physician is Dr. Cam [name].   Review of Systems: Psychiatric: Agitation: No Hallucination: No Depressed Mood: No Insomnia: No Hypersomnia: No Altered Concentration: No Feels Worthless: No Grandiose Ideas: No Belief In Special Powers: No New/Increased Substance Abuse: No Compulsions: No  Neurologic: Headache: Yes Seizure: No Paresthesias: No   Musculoskeletal: Strength & Muscle Tone: within normal limits Gait & Station: normal Patient leans: N/A   Outpatient Encounter Prescriptions as of 03/21/2015  Medication Sig  . Calcium Carbonate-Vit D-Min (CALCIUM 1200 PO) Take by mouth.  . Estradiol (VAGIFEM) 10 MCG TABS vaginal tablet Insert nightly vaginally x 2 weeks then twice weekly  . Multiple Vitamin (MULTIVITAMIN) tablet Take 1 tablet by mouth daily.  . sertraline (ZOLOFT) 100 MG tablet Take 1 tablet (100 mg total) by mouth daily.  . traZODone (DESYREL) 100 MG tablet Take 1 tablet (100 mg total) by mouth at bedtime.  . [DISCONTINUED] sertraline (ZOLOFT) 100 MG tablet Take 1 tablet (100 mg total) by mouth daily.  . [DISCONTINUED] traZODone (DESYREL) 100 MG tablet Take 1 tablet (100 mg total) by mouth at bedtime.   No facility-administered encounter medications on file as of 03/21/2015.    Recent Results (from the past 2160 hour(s))  Pap Test with HP (IPS)     Status:  None   Collection Time: 01/16/15 10:05 AM  Result Value Ref Range   COMMENTS: [facility]     Comment: [address] [place], [place], [place] [address] [address] [place], [place] [address] GYN CYTOLOGY REPORT  PATIENT NAME:Jaime [name] PATHOLOGY#:[id number]: F DOB: 28-Dec-1965  (Age: 61) MEDICAL RECORD UJWJXB:[id number] DOCTOR:Jaime [name], CNM DATE OBTAINED:5/19/2016CLIENT:[facility] DATE RECEIVED:5/20/2016OTHER PHYS: DATE SIGNED:01/21/2015 PAP Thinlayer with HPV Final Cytologic Interpretation:       Cervical, ThinLayer with Automated Imaging and Dual Review, CPT 88175      Negative for Intraepithelial Lesions or Malignancy.       ADEQUACY OF SPECIMEN:           Satisfactory for evaluation. The presence or absence of endocervical cells/transformation zone component cannot be determined due to atrophy.             NOTE: This Pap test has been evaluated with computer assisted technology.       Electronically signed by: PGI, CT(ASCP), [address] [place], [place] [address] (Med. Dir.: Darren Wirthwein)  PAL, CT(ASCP) pgi/5/24/2016The Pap test is a screenin g mechanism with excellent but not perfect ability to prevent cervical carcinoma.  It has a low, but significant, diagnostic error rate. The pap test is suboptimal  for detection of glandular lesions.   It should be noted that a negative result does not definitively rule out the presence of disease.Ref: DeMay, RM, The Art and Science of Cytopathology, [employer], [phone]. HPV Results   High Risk HPV -    Not Detected  A result of "Detected" signifies the presence of one or more high risk types of HPV.  The APTIMA HPV Assay is an in vitro nucleic acid amplification test for the qualitative detection of E6/E7 viral messenger RNA (mRNA) from 14 high-risk types of  human papillomavirus (HPV) in cervical specimens. The high-risk HPV types detected by the assay include: 16, 18, 31, 33, 35, 39, 45, 51, 52, 56, 58, 59, 66, and 68. APTIMA HPV method will be performed on the [employer].  The APTIMA HPV Assay is designed to enhance existing methods for the detection of  cervical disease and should be used in conjunction with clinical information derived from other diagnostic and screening tests,  physical examinations, and full medical  history in accordance with appropriate patient management procedures. The APTIMA HPV Assay on ThinPrep(tm) PreservCyt(tm) specimens is FDA approved on the [employer].The APTIMA HPV Assay on SurePath(tm) specimens was developed and its performance characteristics determined by [facility].   It has not been cleared or approved by the U.S. Food and Drug Administration.  The FDA has determined that such clearance or approval is not necessary.  This test is used for clinical purposes.  This laboratory is certified under the Clinical Laboratory  improvement Amendments of 1988 (CLIA) as qualified to perform high complexity clinical laboratory testing. Electronically signed by: Ottis Stain, MT(ASCP) [address], [place], [place] (Med. Dir.: Darren Maureen Chatters) Last Menstrual Period: 11/29/18 14 Menstrual/Pregnancy History: Post-menopausal   Other Clinical Conditions: Previous Abnormal Pap: ? Etiology   WET PREP BY MOLECULAR PROBE     Status: None   Collection Time: 03/18/15  8:45 AM  Result Value Ref Range   Candida species NEG Negative   Trichomonas vaginosis NEG Negative   Gardnerella vaginalis NEG Negative      Constitutional:  BP 105/68 mmHg  Pulse 67  Ht  (1.702 m)  Wt 142 lb (  64.411 kg)  BMI 22.24 kg/m2  LMP 11/28/2012   Mental Status Examination;  Patient is casually dressed and fairly groomed.  She is pleasant and cooperative.  She described her mood good and her affect is appropriate.  Her speech is fast but coherent.  Her thought process is fast but logical.  She denies any auditory or visual hallucination.  She denies any active or passive suicidal parts or homicidal thought.  Her attention concentration is distracted at times.  There were no delusions, paranoia or any obsessive thoughts.  Her psychomotor activity is slightly increased.  Her fund of knowledge is adequate.  Her cognition is good.  She is  alert and oriented 3.  Her insight judgment and impulse control is okay.   Established Problem, Stable/Improving (1), Review of Psycho-Social Stressors (1), Review of Last Therapy Session (1) and Review of Medication Regimen & Side Effects (2)  Assessment: Axis I: Generalized anxiety disorder.    Axis II: Deferred  Axis III:  Past Medical History  Diagnosis Date  . Anxiety   . Depression   . Fatigue   . Abnormal Pap smear of cervix ~2006     Plan:  Patient is stable on her current medication.  She is taking trazodone 100 mg at bedtime and Zoloft 100 mg daily.  She has no tremors, shakes or any side effects.  I will continue her medication.  Recommended to call us back if she has any question or any concern.  Follow-up in 3 months.  ARFEEN,SYED T., MD 03/21/2015

## 2015-03-26 ENCOUNTER — Telehealth: Payer: Self-pay | Admitting: Certified Nurse Midwife

## 2015-03-26 NOTE — Telephone Encounter (Signed)
Spoke with patient. Patient states that her insurance company is not providing coverage for her 3D mammogram and this will be over 300 dollars. States "If Debbie recommended it. I do not understand why it is not covered." Advised patient we do recommend 3D screenings but can not guarantee insurance coverage. Advised patient if she has any questions regarding billing will need to speak with her insurance company or [facility] as they performed the service and file it with her insurance. Patient is agreeable and verbalizes understanding.  Routing to provider for final review. Patient agreeable to disposition. Will close encounter.   Patient aware provider will review message and nurse will return call if any additional advice or change of disposition.

## 2015-03-26 NOTE — Telephone Encounter (Signed)
Patient has some questions regarding her 3D mmg done recently.

## 2015-04-24 ENCOUNTER — Encounter: Payer: Self-pay | Admitting: Certified Nurse Midwife

## 2015-04-24 ENCOUNTER — Ambulatory Visit (INDEPENDENT_AMBULATORY_CARE_PROVIDER_SITE_OTHER): Payer: 59 | Admitting: Certified Nurse Midwife

## 2015-04-24 VITALS — BP 110/70 | HR 72 | Resp 20 | Ht 66.5 in | Wt 140.0 lb

## 2015-04-24 DIAGNOSIS — N951 Menopausal and female climacteric states: Secondary | ICD-10-CM | POA: Diagnosis not present

## 2015-04-24 DIAGNOSIS — N952 Postmenopausal atrophic vaginitis: Secondary | ICD-10-CM | POA: Diagnosis not present

## 2015-04-24 MED ORDER — ESTRADIOL 10 MCG VA TABS: ORAL_TABLET | VAGINAL | Status: DC

## 2015-04-24 NOTE — Progress Notes (Signed)
49 y.o. Married Caucasian female G0P0000 here for follow up of atrophic vaginitis treated with Vagifem initiated on July19,2016. Using medication as directed  Reports after using Vagifem for 2 weeks nightly symptoms resolved. Started 2 x weekly and begin to feel irritated again. Using coconut oil prn also. Slight vaginal itching again as before with dryness. No vaginal bleeding or change in discharge. No other concerns.Leaving for [place] in 4 days.  O: Healthy WD,WN female Affect: normal, orientation x 3  Abdomen:soft non tender Pelvic exam:EXTERNAL GENITALIA: normal appearing vulva with no masses, tenderness or lesions VAGINA: no abnormal discharge or lesions and atrophy noted, but no redness, pink in color  Affirm taken, no Vagifem residue noted CERVIX: no lesions or cervical motion tenderness and normal Declined pelvic exam  A:Atrophic vaginitis improving with Vagifem use R/O vaginal infection   P: Discussed findings of improved tissues with Vagifem use.Recommend another week of nightly use and then 3 times nightly for the next 4 weeks and advise to progress. If still uncomfortable may consider E-string. Agreeable. Will treat per affirm results.  RV prn

## 2015-04-24 NOTE — Patient Instructions (Signed)

## 2015-04-25 ENCOUNTER — Telehealth: Payer: Self-pay | Admitting: Certified Nurse Midwife

## 2015-04-25 ENCOUNTER — Other Ambulatory Visit: Payer: Self-pay | Admitting: Certified Nurse Midwife

## 2015-04-25 DIAGNOSIS — B9689 Other specified bacterial agents as the cause of diseases classified elsewhere: Secondary | ICD-10-CM

## 2015-04-25 DIAGNOSIS — N760 Acute vaginitis: Principal | ICD-10-CM

## 2015-04-25 LAB — WET PREP BY MOLECULAR PROBE
Candida species: NEGATIVE
Gardnerella vaginalis: POSITIVE — AB
Trichomonas vaginosis: NEGATIVE

## 2015-04-25 MED ORDER — HYLAFEM VA SUPP
1.0000 | Freq: Every day | VAGINAL | Status: DC
Start: 2015-04-25 — End: 2015-06-24

## 2015-04-25 NOTE — Telephone Encounter (Signed)
I would prefer she use this due her  Vaginal dryness appearance. She is just now getting vaginal flora build up with Vagifem

## 2015-04-25 NOTE — Telephone Encounter (Signed)
Spoke with patient. Patient states that she is having a hard time getting her Hylafem filled. Was told they would need to order the rx and it would be in on Monday. Advised it is okay to wait to use Hylafem on Monday. Patient states she is going out of town until Thursday. Advised it is okay to wait to start treatment for BV on Thursday. Patient is uncomfortable waiting and is requesting an alternative. Offered to send rx to another pharmacy for patient for earlier pick up. States she has called three local pharmacies and they do not have it. Advised I will speak with Verner Chol CNM regarding an alternative to Hylafem and return call. Patient is agreeable.

## 2015-04-25 NOTE — Telephone Encounter (Signed)
Spoke with patient. Advised of message as seen below from Verner Chol CNM. Patient is agreeable. Will go ahead and have rx filled and pick up when she returns from her trip.  Routing to provider for final review. Patient agreeable to disposition. Will close encounter.

## 2015-04-25 NOTE — Telephone Encounter (Signed)
Patient says her pharmacy is having a hard time filling her prescription.

## 2015-04-25 NOTE — Progress Notes (Signed)
Reviewed personally.  M. Suzanne Avery Klingbeil, MD.  

## 2015-04-30 ENCOUNTER — Ambulatory Visit: Payer: 59 | Admitting: Certified Nurse Midwife

## 2015-06-23 ENCOUNTER — Ambulatory Visit (HOSPITAL_COMMUNITY): Payer: Self-pay | Admitting: Psychiatry

## 2015-06-24 ENCOUNTER — Encounter: Payer: Self-pay | Admitting: Certified Nurse Midwife

## 2015-06-24 ENCOUNTER — Ambulatory Visit (INDEPENDENT_AMBULATORY_CARE_PROVIDER_SITE_OTHER): Payer: 59 | Admitting: Certified Nurse Midwife

## 2015-06-24 VITALS — BP 108/64 | HR 70 | Resp 16 | Ht 66.5 in | Wt 142.0 lb

## 2015-06-24 DIAGNOSIS — A499 Bacterial infection, unspecified: Secondary | ICD-10-CM | POA: Diagnosis not present

## 2015-06-24 DIAGNOSIS — N952 Postmenopausal atrophic vaginitis: Secondary | ICD-10-CM

## 2015-06-24 DIAGNOSIS — N760 Acute vaginitis: Secondary | ICD-10-CM

## 2015-06-24 DIAGNOSIS — B9689 Other specified bacterial agents as the cause of diseases classified elsewhere: Secondary | ICD-10-CM

## 2015-06-24 NOTE — Progress Notes (Signed)
49 y.o. Married Caucasian female G0P0000 here for follow up of atrophic vaginitis treated with Vagifem initiated on 04/24/15. Using Vagifem 3 times weekly for the past 4 weeks  but continues to feel irritation after using Vagifem for just 24 hours after. Area of concern is introital area only, otherwise feeling much better. Using Coconut oil on days she does not use Vagifem. Not sexually active again with spouse. Used Hylafem as prescribed for BV and has noted no other symptoms. Recent trip to [place] with family and had no issues with dryness with coconut oil use while traveling and walking. No other health issues today.   O: Healthy WD,WN female Affect: normal, orientation x 3 Skin:warm and dry Abdomen:soft non tender Pelvic exam:EXTERNAL GENITALIA: normal appearing vulva with no masses, tenderness or lesions VAGINA: no abnormal discharge or lesions and moisture noted, non tender. Slight pink increase( was red at last visit) only at introital area only. Ph 4.0 Wet prep taken CERVIX: no lesions or cervical motion tenderness and normal appearance UTERUS: normal non tender ADNEXA: no masses palpable and nontender RECTUM: exam not indicated external area  Normal appearance  A:Atrophic Vaginitis responding well Vagifem BV resolved Normal pelvic exam  Wet prep: negative for pathogens  P: Discussed findings of normal moisture in vagina and Vagifem increase has changed the appearance which has improved. Recommend she now go back to twice weekly use and coconut oil use in between and as often as she needs it for comfort. Discussed specifically at introital area. Patient agreeable and will advise if issues with increase dryness or product use.  Reassured Bv resolved with Hylafem use. Will advise if symptoms return.questions addressed.   RV prn/aex

## 2015-06-24 NOTE — Patient Instructions (Signed)

## 2015-06-25 NOTE — Progress Notes (Signed)
Encounter reviewed Meena Barrantes, MD   

## 2015-06-26 ENCOUNTER — Encounter (HOSPITAL_COMMUNITY): Payer: Self-pay | Admitting: Psychiatry

## 2015-06-26 ENCOUNTER — Ambulatory Visit (INDEPENDENT_AMBULATORY_CARE_PROVIDER_SITE_OTHER): Payer: 59 | Admitting: Psychiatry

## 2015-06-26 VITALS — BP 120/73 | HR 59 | Wt 141.6 lb

## 2015-06-26 DIAGNOSIS — F411 Generalized anxiety disorder: Secondary | ICD-10-CM | POA: Diagnosis not present

## 2015-06-26 MED ORDER — TRAZODONE HCL 100 MG PO TABS: 100.0000 mg | ORAL_TABLET | Freq: Every day | ORAL | Status: DC

## 2015-06-26 MED ORDER — SERTRALINE HCL 100 MG PO TABS: 100.0000 mg | ORAL_TABLET | Freq: Every day | ORAL | Status: DC

## 2015-06-26 NOTE — Progress Notes (Signed)
[facility] [id number] Progress Note   Jaime [name] [id number] 49 y.o.  06/26/2015 10:04 AM  Chief Complaint:  Medication management and follow-up.     History of Present Illness:  Jaime [name] came for her follow-up appointment.  She is sleeping better patient denies any major panic attack or any irritability.  She is taking her medication as prescribed.  He has no side effects.  She admitted some time having issues with her sister who has bipolar disorder but things are going very well.  Her husband is very supportive.  Patient denies any agitation, anger, mood swing.  She denies any paranoia or any hallucination.  She wants to continue her current psychiatric medication because she feels good when she takes it.  She denies any feeling of hopelessness or worthlessness.  Her appetite is okay.  Her vitals are stable.   Patient lives with her husband and she's been married for 23 years.  Patient is self-employed and she clean people house.  Patient planning to spend Thanksgiving at her mother-in-law's house.  Suicidal Ideation: No Plan Formed: No Patient has means to carry out plan: No  Homicidal Ideation: No Plan Formed: No Patient has means to carry out plan: No  Past Psychiatric History/Hospitalization(s) Patient has history of depression for more than 15 years and she has given in the past Wellbutrin which cause weight gain and she believed given Prozac but did not help her.  Recently her OB/GYN given her amitriptyline which cause anxiety get worse.  She had a good response with Zoloft in the past.  She has never seen psychiatrist before.  Patient denies any history of mania, psychosis or any hallucination. Anxiety: Yes Bipolar Disorder: No Depression: Yes Mania: No Psychosis: No Schizophrenia: No Personality Disorder: No Hospitalization for psychiatric illness: No History of Electroconvulsive Shock Therapy: No Prior Suicide Attempts: No  Medical History; Patient has plantar  fasciitis and recently vaginitis and diagnosed with menopause.  She had tried estrogen but it cause worsening of anxiety.  Her primary care physician is Dr. Cam Hai.   Review of Systems: Psychiatric: Agitation: No Hallucination: No Depressed Mood: No Insomnia: No Hypersomnia: No Altered Concentration: No Feels Worthless: No Grandiose Ideas: No Belief In Special Powers: No New/Increased Substance Abuse: No Compulsions: No  Neurologic: Headache: Yes Seizure: No Paresthesias: No   Musculoskeletal: Strength & Muscle Tone: within normal limits Gait & Station: normal Patient leans: N/A   Outpatient Encounter Prescriptions as of 06/26/2015  Medication Sig  . Calcium Carbonate-Vit D-Min (CALCIUM 1200 PO) Take by mouth.  . Estradiol (VAGIFEM) 10 MCG TABS vaginal tablet Insert nightly vaginally x 2 weeks then three times weekly  . Multiple Vitamin (MULTIVITAMIN) tablet Take 1 tablet by mouth daily.  . sertraline (ZOLOFT) 100 MG tablet Take 1 tablet (100 mg total) by mouth daily.  . traZODone (DESYREL) 100 MG tablet Take 1 tablet (100 mg total) by mouth at bedtime.  . [DISCONTINUED] sertraline (ZOLOFT) 100 MG tablet Take 1 tablet (100 mg total) by mouth daily.  . [DISCONTINUED] traZODone (DESYREL) 100 MG tablet Take 1 tablet (100 mg total) by mouth at bedtime.   No facility-administered encounter medications on file as of 06/26/2015.    Recent Results (from the past 2160 hour(s))  WET PREP BY MOLECULAR PROBE     Status: Abnormal   Collection Time: 04/24/15  4:44 PM  Result Value Ref Range   Candida species NEG Negative   Trichomonas vaginosis NEG Negative   Gardnerella vaginalis POS (A)  Negative      Constitutional:  BP 120/73 mmHg  Pulse 59  Wt 141 lb 9.6 oz (64.229 kg)  LMP 11/28/2012   Mental Status Examination;  Patient is casually dressed and fairly groomed.  She is pleasant and cooperative.  She described her mood good and her affect is appropriate.  Her  speech is clear and coherent.  Her thought process is logical and goal-directed.  She denies any auditory or visual hallucination.  She denies any active or passive suicidal parts or homicidal thought.  Her attention concentration is distracted at times.  There were no delusions, paranoia or any obsessive thoughts.  Her psychomotor activity is slightly increased.  Her fund of knowledge is adequate.  Her cognition is good.  She is alert and oriented 3.  Her insight judgment and impulse control is okay.   Established Problem, Stable/Improving (1), Review of Psycho-Social Stressors (1), Review of Last Therapy Session (1) and Review of Medication Regimen & Side Effects (2)  Assessment: Axis I: Generalized anxiety disorder.    Axis II: Deferred  Axis III:  Past Medical History  Diagnosis Date  . Anxiety   . Depression   . Fatigue   . Abnormal Pap smear of cervix ~2006     Plan:  Patient is doing better on her current psychiatric medication.  She has no side effects.  I will continue trazodone 100 mg at bedtime and Zoloft 100 mg daily.  She has no tremors, shakes or any EPS.  Patient is not interested in counseling at this time.  Recommended to call us back if she has any question, concern or if she feels worsening of the symptom.  Follow-up in 3 months.  ARFEEN,SYED T., MD 06/26/2015

## 2015-08-20 ENCOUNTER — Ambulatory Visit (INDEPENDENT_AMBULATORY_CARE_PROVIDER_SITE_OTHER): Payer: 59 | Admitting: Certified Nurse Midwife

## 2015-08-20 ENCOUNTER — Encounter: Payer: Self-pay | Admitting: Certified Nurse Midwife

## 2015-08-20 VITALS — BP 104/64 | HR 68 | Temp 98.3°F | Resp 16 | Ht 66.5 in | Wt 137.0 lb

## 2015-08-20 DIAGNOSIS — R319 Hematuria, unspecified: Secondary | ICD-10-CM

## 2015-08-20 DIAGNOSIS — N9489 Other specified conditions associated with female genital organs and menstrual cycle: Secondary | ICD-10-CM | POA: Diagnosis not present

## 2015-08-20 DIAGNOSIS — N951 Menopausal and female climacteric states: Secondary | ICD-10-CM | POA: Diagnosis not present

## 2015-08-20 DIAGNOSIS — N390 Urinary tract infection, site not specified: Secondary | ICD-10-CM | POA: Diagnosis not present

## 2015-08-20 DIAGNOSIS — N898 Other specified noninflammatory disorders of vagina: Secondary | ICD-10-CM

## 2015-08-20 LAB — POCT URINALYSIS DIPSTICK
BILIRUBIN UA: NEGATIVE
Glucose, UA: NEGATIVE
KETONES UA: NEGATIVE
LEUKOCYTES UA: NEGATIVE
NITRITE UA: NEGATIVE
PROTEIN UA: NEGATIVE
Urobilinogen, UA: NEGATIVE
pH, UA: 5

## 2015-08-20 MED ORDER — NITROFURANTOIN MONOHYD MACRO 100 MG PO CAPS: 100.0000 mg | ORAL_CAPSULE | Freq: Two times a day (BID) | ORAL | Status: DC

## 2015-08-20 NOTE — Progress Notes (Signed)
49 yo g0p0 here with complaint of vaginal symptoms of itching,  and odor. Describes discharge as scant to none. Uses Vagifem for vaginal dryness with good results. Also complaining of urinary frequency,urgency and pain with urination. Denies fever, chills or back pain. Onset of symptoms 3 days ago. Denies new personal products. Menopausal no HRT. Patient was very upset was see by PCP 08/19/15 and was told a urine culture would be sent and she would be contacted  with result and was given Diflucan to take in case it was yeast. No physical exam. Symptoms have increased over night with urinary discomfort. Took the Diflucan with no change. Has used Vagifem and coconut oil as instructed without problems. No other health issues today.   O:Healthy female WDWN Affect: normal, orientation x 3 POCT urine: 1+ blood  Exam:Skin: Warm and dry CVAT: negative bilateral Abdomen:soft, positive suprapubic Lymph node: no enlargement or tenderness Pelvic exam: External genital: normal female, no scaling, or exudate Bartholin/skene glands negative Urethra,Bladder, tender, urethral meatus tender and red Vagina: scant  discharge  Non odorous noted. , Affirm taken Cervix: normal, non tender, no CMT Uterus: normal, non tender Adnexa:normal, non tender, no masses or fullness noted   A:Normal pelvic exam poct urine-RBC 1+ UTI symptomatic Vaginal dryness using Vagifem and coconut oil with good results R/O vaginal infection  P:Discussed findings of UTI and etiology. Instructed to limit soda, coffee and tea use. Increase water intake. Warning signs of UTI given and need to advise if occurs. Rx: Macrobid see order with instructions Lab Urine micro, Affirm Continue Azo tid no more than 4 days. Continue Vagifem as instructed. Will treat per affirm if indicated.  Rv prn

## 2015-08-20 NOTE — Patient Instructions (Signed)

## 2015-08-21 ENCOUNTER — Telehealth: Payer: Self-pay | Admitting: Certified Nurse Midwife

## 2015-08-21 LAB — URINALYSIS, MICROSCOPIC ONLY
BACTERIA UA: NONE SEEN [HPF]
CRYSTALS: NONE SEEN [HPF]
Casts: NONE SEEN [LPF]
WBC UA: NONE SEEN WBC/HPF (ref <=5)
Yeast: NONE SEEN [HPF]

## 2015-08-21 LAB — WET PREP BY MOLECULAR PROBE
Candida species: NEGATIVE
GARDNERELLA VAGINALIS: NEGATIVE
Trichomonas vaginosis: NEGATIVE

## 2015-08-21 NOTE — Telephone Encounter (Signed)
Patient called and said, "I just need to get Jaime [name] a message. Tell her, the test results are negative. She'll know what I mean. I don't think I need to tell you any thing else and I don't need a call back."

## 2015-08-21 NOTE — Telephone Encounter (Signed)
Patient referring to urine culture with PCP

## 2015-08-21 NOTE — Telephone Encounter (Signed)
Routing to Deborah Leonard CNM as FYI. Will close encounter. 

## 2015-08-22 NOTE — Progress Notes (Signed)
Reviewed personally.  M. Suzanne Standley Bargo, MD.  

## 2015-09-29 ENCOUNTER — Encounter (HOSPITAL_COMMUNITY): Payer: Self-pay | Admitting: Psychiatry

## 2015-09-29 ENCOUNTER — Ambulatory Visit (INDEPENDENT_AMBULATORY_CARE_PROVIDER_SITE_OTHER): Payer: 59 | Admitting: Psychiatry

## 2015-09-29 VITALS — BP 137/80 | HR 62 | Ht 67.0 in | Wt 138.2 lb

## 2015-09-29 DIAGNOSIS — F411 Generalized anxiety disorder: Secondary | ICD-10-CM | POA: Diagnosis not present

## 2015-09-29 MED ORDER — TRAZODONE HCL 100 MG PO TABS: 100.0000 mg | ORAL_TABLET | Freq: Every day | ORAL | Status: DC

## 2015-09-29 MED ORDER — SERTRALINE HCL 100 MG PO TABS: 100.0000 mg | ORAL_TABLET | Freq: Every day | ORAL | Status: DC

## 2015-09-29 NOTE — Progress Notes (Signed)
[facility] [id number] Progress Note   Jaime [name] [id number] 50 y.o.  09/29/2015 11:10 AM  Chief Complaint:  Medication management and follow-up.     History of Present Illness:  Jaime [name] came for her follow-up appointment.  She had a good Christmas.  She is taking her medication as prescribed.  She denies any major panic attack or any nervousness.  Her sleep is good.  She denies any irritability, anger, crying spells or any feeling of hopelessness or worthlessness.  She has no tremors or shakes.  She takes Zoloft and trazodone which is helping her anxiety and depression.  Her energy level is good.  Patient denies drinking or using any illegal substances. Her appetite is okay.  Her vitals are stable.   Patient lives with her husband and she's been married for 23 years.  Patient is self-employed and she clean people house.  Recently she had a UTI which is resolved by taking antibiotic.  Suicidal Ideation: No Plan Formed: No Patient has means to carry out plan: No  Homicidal Ideation: No Plan Formed: No Patient has means to carry out plan: No  Past Psychiatric History/Hospitalization(s) Patient has history of depression and in the past given Wellbutrin and Prozac with limited response and cause weight gain.  She also tried amitriptyline which is given by her OB/GYN but her anxiety got worse.  Patient denies any history of mania, psychosis or any hallucination. Anxiety: Yes Bipolar Disorder: No Depression: Yes Mania: No Psychosis: No Schizophrenia: No Personality Disorder: No Hospitalization for psychiatric illness: No History of Electroconvulsive Shock Therapy: No Prior Suicide Attempts: No  Medical History; Patient has plantar fasciitis and recently vaginitis and diagnosed with menopause.  She had tried estrogen but it cause worsening of anxiety.  Her primary care physician is Dr. Cam Hai.   Review of Systems: Psychiatric: Agitation: No Hallucination:  No Depressed Mood: No Insomnia: No Hypersomnia: No Altered Concentration: No Feels Worthless: No Grandiose Ideas: No Belief In Special Powers: No New/Increased Substance Abuse: No Compulsions: No  Neurologic: Headache: Yes Seizure: No Paresthesias: No   Musculoskeletal: Strength & Muscle Tone: within normal limits Gait & Station: normal Patient leans: N/A   Outpatient Encounter Prescriptions as of 09/29/2015  Medication Sig  . Calcium Carbonate-Vit D-Min (CALCIUM 1200 PO) Take by mouth.  . Estradiol (VAGIFEM) 10 MCG TABS vaginal tablet Insert nightly vaginally x 2 weeks then three times weekly  . Multiple Vitamin (MULTIVITAMIN) tablet Take 1 tablet by mouth daily.  . nitrofurantoin, macrocrystal-monohydrate, (MACROBID) 100 MG capsule Take 1 capsule (100 mg total) by mouth 2 (two) times daily.  . sertraline (ZOLOFT) 100 MG tablet Take 1 tablet (100 mg total) by mouth daily.  . traZODone (DESYREL) 100 MG tablet Take 1 tablet (100 mg total) by mouth at bedtime.  . [DISCONTINUED] sertraline (ZOLOFT) 100 MG tablet Take 1 tablet (100 mg total) by mouth daily.  . [DISCONTINUED] traZODone (DESYREL) 100 MG tablet Take 1 tablet (100 mg total) by mouth at bedtime.   No facility-administered encounter medications on file as of 09/29/2015.    Recent Results (from the past 2160 hour(s))  POCT Urinalysis Dipstick     Status: None   Collection Time: 08/20/15 11:26 AM  Result Value Ref Range   Color, UA yellow    Clarity, UA clear    Glucose, UA n    Bilirubin, UA n    Ketones, UA n    Spec Grav, UA     Blood, UA 1+  pH, UA 5.0    Protein, UA n    Urobilinogen, UA negative    Nitrite, UA n    Leukocytes, UA Negative Negative  WET PREP BY MOLECULAR PROBE     Status: None   Collection Time: 08/20/15  2:30 PM  Result Value Ref Range   Candida species NEG Negative   Trichomonas vaginosis NEG Negative   Gardnerella vaginalis NEG Negative  Urine Microscopic     Status: Abnormal    Collection Time: 08/20/15  2:30 PM  Result Value Ref Range   WBC, UA NONE SEEN <=5 WBC/HPF   RBC / HPF 3-10 (A) <=2 RBC/HPF   Squamous Epithelial / LPF 0-5 <=5 HPF   Bacteria, UA NONE SEEN NONE SEEN HPF   Crystals NONE SEEN NONE SEEN HPF   Casts NONE SEEN NONE SEEN LPF   Yeast NONE SEEN NONE SEEN HPF      Constitutional:  BP 137/80 mmHg  Pulse 62  Ht  (1.702 m)  Wt 138 lb 3.2 oz (62.687 kg)  BMI 21.64 kg/m2  LMP 11/28/2012   Mental Status Examination;  Patient is casually dressed and fairly groomed.  She is pleasant and cooperative.  She described her mood euthymic and her affect is appropriate.  Her speech is clear and coherent.  Her thought process is logical and goal-directed.  She denies any auditory or visual hallucination.  She denies any active or passive suicidal parts or homicidal thought.  Her attention concentration is distracted at times.  There were no delusions, paranoia or any obsessive thoughts.  Her psychomotor activity is slightly increased.  Her fund of knowledge is adequate.  Her cognition is good.  She is alert and oriented 3.  Her insight judgment and impulse control is okay.   Established Problem, Stable/Improving (1), Review of Psycho-Social Stressors (1), Review of Last Therapy Session (1) and Review of Medication Regimen & Side Effects (2)  Assessment: Axis I: Generalized anxiety disorder.    Axis II: Deferred  Axis III:  Past Medical History  Diagnosis Date  . Anxiety   . Depression   . Fatigue   . Abnormal Pap smear of cervix ~2006     Plan:  Patient is doing better on her current psychiatric medication.  She has no side effects.  I will continue trazodone 100 mg at bedtime and Zoloft 100 mg daily.  She has no tremors, shakes or any EPS.  Patient is not interested in counseling at this time.  Recommended to call us back if she has any question, concern or if she feels worsening of the symptom.  Follow-up in 3 months.  Lamonica Trueba T.,  MD 09/29/2015

## 2015-12-29 ENCOUNTER — Ambulatory Visit (INDEPENDENT_AMBULATORY_CARE_PROVIDER_SITE_OTHER): Payer: 59 | Admitting: Psychiatry

## 2015-12-29 ENCOUNTER — Encounter (HOSPITAL_COMMUNITY): Payer: Self-pay | Admitting: Psychiatry

## 2015-12-29 VITALS — BP 118/70 | HR 67 | Ht 67.0 in | Wt 136.4 lb

## 2015-12-29 DIAGNOSIS — F411 Generalized anxiety disorder: Secondary | ICD-10-CM | POA: Diagnosis not present

## 2015-12-29 DIAGNOSIS — F331 Major depressive disorder, recurrent, moderate: Secondary | ICD-10-CM

## 2015-12-29 MED ORDER — TRAZODONE HCL 100 MG PO TABS: 100.0000 mg | ORAL_TABLET | Freq: Every day | ORAL | Status: DC

## 2015-12-29 MED ORDER — SERTRALINE HCL 100 MG PO TABS: ORAL_TABLET | ORAL | Status: DC

## 2015-12-29 NOTE — Progress Notes (Signed)
[facility] [id number] Progress Note   Jaime [name] [id number] 50 y.o.  12/29/2015 9:05 AM  Chief Complaint:  e I don't think my medicine is working.  I'm very emotional.  I cannot focus.  I'm having issues with my sister.       History of Present Illness:  Jaime [name] came for her follow-up appointment.   Patient reported compliant with medication but noticed for past 4 weeks she is feeling more emotional, depressed, isolated and withdrawn.  When she come from work she does not do anything and stays to herself.  She is having issues with her adopted sister .  Patient told she had bipolar disorder and she does not understand some time her issues.  Though she is sleeping good but admitted lack of energy, anhedonia, irritability, crying spells and there are times when she does not enjoy eating.  She reported no side effects from Zoloft.  She denies any paranoia or any hallucination but endorsed sometime feeling hopeless helpless and worthless.  Her husband is very supportive .  She's been living with her husband for more than 23 years.  She continues to work as a Education officer, environmental lady but lately she has noticed that she does not enjoy her work.  Patient denies drinking or using any illegal substances.  Her energy level is low .  Her appetite is fair.  Her vitals are stable.  Patient denies any aggressive behavior, self abusive behavior or any mania.    Suicidal Ideation: No Plan Formed: No Patient has means to carry out plan: No  Homicidal Ideation: No Plan Formed: No Patient has means to carry out plan: No  Past Psychiatric History/Hospitalization(s) Patient has history of depression and in the past given Wellbutrin and Prozac with limited response and cause weight gain.  She also tried amitriptyline which is given by her OB/GYN but her anxiety got worse.  Patient denies any history of mania, psychosis or any hallucination. Anxiety: Yes Bipolar Disorder: No Depression: Yes Mania: No Psychosis:  No Schizophrenia: No Personality Disorder: No Hospitalization for psychiatric illness: No History of Electroconvulsive Shock Therapy: No Prior Suicide Attempts: No  Medical History; Patient has plantar fasciitis and recently vaginitis and diagnosed with menopause.  She had tried estrogen but it cause worsening of anxiety.  Her primary care physician is Dr. Cam Hai.   Family history; Patient does not know family history because she was adopted when only 79 days old.    Review of Systems: Psychiatric: Agitation: Irritability and emotional Hallucination: No Depressed Mood: Yes Insomnia: No Hypersomnia: No Altered Concentration: No Feels Worthless: No Grandiose Ideas: No Belief In Special Powers: No New/Increased Substance Abuse: No Compulsions: No  Neurologic: Headache: Yes Seizure: No Paresthesias: No   Musculoskeletal: Strength & Muscle Tone: within normal limits Gait & Station: normal Patient leans: N/A   Outpatient Encounter Prescriptions as of 12/29/2015  Medication Sig  . Calcium Carbonate-Vit D-Min (CALCIUM 1200 PO) Take by mouth.  . Estradiol (VAGIFEM) 10 MCG TABS vaginal tablet Insert nightly vaginally x 2 weeks then three times weekly  . Multiple Vitamin (MULTIVITAMIN) tablet Take 1 tablet by mouth daily.  . nitrofurantoin, macrocrystal-monohydrate, (MACROBID) 100 MG capsule Take 1 capsule (100 mg total) by mouth 2 (two) times daily.  . sertraline (ZOLOFT) 100 MG tablet Take 1 and 1/2 tab daily  . traZODone (DESYREL) 100 MG tablet Take 1 tablet (100 mg total) by mouth at bedtime.  . [DISCONTINUED] sertraline (ZOLOFT) 100 MG tablet Take 1 tablet (100  mg total) by mouth daily.  . [DISCONTINUED] traZODone (DESYREL) 100 MG tablet Take 1 tablet (100 mg total) by mouth at bedtime.   No facility-administered encounter medications on file as of 12/29/2015.    No results found for this or any previous visit (from the past 2160 hour(s)).    Constitutional:  BP  118/70 mmHg  Pulse 67  Ht 5\' 7"  (1.702 m)  Wt 136 lb 6.4 oz (61.871 kg)  BMI 21.36 kg/m2  LMP 11/28/2012   Psychiatric Specialty Exam: Physical Exam  ROS  Blood pressure 118/70, pulse 67, height 5\' 7"  (1.702 m), weight 136 lb 6.4 oz (61.871 kg), last menstrual period 11/28/2012.Body mass index is 21.36 kg/(m^2).  General Appearance: Casual  Eye Contact::  Fair  Speech:  Slow  Volume:  Decreased  Mood:  Anxious, Depressed and Dysphoric  Affect:  Constricted  Thought Process:  Goal Directed  Orientation:  Full (Time, Place, and Person)  Thought Content:  Rumination  Suicidal Thoughts:  No  Homicidal Thoughts:  No  Memory:  Immediate;   Fair Recent;   Fair Remote;   Fair  Judgement:  Good  Insight:  Good  Psychomotor Activity:  Decreased  Concentration:  Fair  Recall:  Good  Fund of Knowledge:  Good  Language:  Good  Akathisia:  No  Handed:  Right  AIMS (if indicated):     Assets:  Communication Skills Desire for Improvement Financial Resources/Insurance Housing  ADL's:  Intact  Cognition:  WNL  Sleep:       Established Problem, Stable/Improving (1), Review of Psycho-Social Stressors (1), Review and summation of old records (2), Established Problem, Worsening (2), Review of Last Therapy Session (1), Independent Review of image, tracing or specimen (2) and Review of Medication Regimen & Side Effects (2)  Assessment: Axis I: Generalized anxiety disorder.   Major depressive disorder, recurrent moderate  Axis II: Deferred  Axis III:  Past Medical History  Diagnosis Date  . Anxiety   . Depression   . Fatigue   . Abnormal Pap smear of cervix ~2006     Plan:  Patient is slowly decompensating.  Discuss psychosocial stressors.  Offered counseling but patient declined.  Recommended to try Zoloft 150 mg daily and continue trazodone 100 mg at bedtime.  Discussed medication side effects and benefits.  Recommended to call us back if she has any question, concern or if  she feels worsening of the symptom.  Discuss safety plan that anytime having active suicidal thoughts or homicidal.  Continue to call 911 or go to the local emergency room.  Follow-up in 6 weeks.  Aaira Oestreicher T., MD 12/29/2015

## 2016-02-09 ENCOUNTER — Encounter (HOSPITAL_COMMUNITY): Payer: Self-pay | Admitting: Psychiatry

## 2016-02-09 ENCOUNTER — Ambulatory Visit (INDEPENDENT_AMBULATORY_CARE_PROVIDER_SITE_OTHER): Payer: 59 | Admitting: Psychiatry

## 2016-02-09 VITALS — BP 110/64 | HR 57 | Ht 67.0 in | Wt 138.4 lb

## 2016-02-09 DIAGNOSIS — F331 Major depressive disorder, recurrent, moderate: Secondary | ICD-10-CM | POA: Diagnosis not present

## 2016-02-09 DIAGNOSIS — F411 Generalized anxiety disorder: Secondary | ICD-10-CM

## 2016-02-09 MED ORDER — ESCITALOPRAM OXALATE 20 MG PO TABS: 20.0000 mg | ORAL_TABLET | Freq: Every day | ORAL | Status: DC

## 2016-02-09 NOTE — Progress Notes (Signed)
[facility] [phone] Progress Note   Jaime [name] [id number] 50 y.o.  02/09/2016 10:06 AM  Chief Complaint:  I felt better for few days and then I started feeling depressed again.  I have a lot of crying spells.  I do not like that.  I think any itching medication.         History of Present Illness:  Jaime [name] came for her follow-up appointment.   On her last visit we increased her Zoloft 150 mg.  She is still have crying spells, anhedonia, lack of motivation and feeling depressed.  She continues to have issues with her adopted sister but it does not bother her that much.  She is sleeping okay.  She continues to work and her husband is very supportive.  She reported no side effects from Cymbalta.  Her sleep is fair.  However she still have lack of motivation, fatigue, fair attention and concentration.  Her appetite is fair.  Her oral signs are stable.  Patient denies any drinking alcohol or using any illegal substances. Patient denies any aggressive behavior, self abusive behavior or any mania.    Suicidal Ideation: No Plan Formed: No Patient has means to carry out plan: No  Homicidal Ideation: No Plan Formed: No Patient has means to carry out plan: No  Past Psychiatric History/Hospitalization(s) Patient has history of depression and in the past given Wellbutrin and Prozac with limited response and cause weight gain.  She also tried amitriptyline which is given by her OB/GYN but her anxiety got worse.  Patient denies any history of mania, psychosis or any hallucination. Anxiety: Yes Bipolar Disorder: No Depression: Yes Mania: No Psychosis: No Schizophrenia: No Personality Disorder: No Hospitalization for psychiatric illness: No History of Electroconvulsive Shock Therapy: No Prior Suicide Attempts: No  Medical History; Patient has plantar fasciitis and recently vaginitis and diagnosed with menopause.  She had tried estrogen but it cause worsening of anxiety.  Her primary  care physician is Dr. Cam HaiKimberly shaw.   Family history; Patient does not know family history because she was adopted when only 73 days old.    Review of Systems: Psychiatric: Agitation: Irritability and emotional Hallucination: No Depressed Mood: Yes Insomnia: No Hypersomnia: No Altered Concentration: No Feels Worthless: No Grandiose Ideas: No Belief In Special Powers: No New/Increased Substance Abuse: No Compulsions: No  Neurologic: Headache: Yes Seizure: No Paresthesias: No   Musculoskeletal: Strength & Muscle Tone: within normal limits Gait & Station: normal Patient leans: N/A   Outpatient Encounter Prescriptions as of 02/09/2016  Medication Sig  . Calcium Carbonate-Vit D-Min (CALCIUM 1200 PO) Take by mouth.  . escitalopram (LEXAPRO) 20 MG tablet Take 1 tablet (20 mg total) by mouth daily.  . Estradiol (VAGIFEM) 10 MCG TABS vaginal tablet Insert nightly vaginally x 2 weeks then three times weekly  . Multiple Vitamin (MULTIVITAMIN) tablet Take 1 tablet by mouth daily.  . traZODone (DESYREL) 100 MG tablet Take 1 tablet (100 mg total) by mouth at bedtime.  . [DISCONTINUED] nitrofurantoin, macrocrystal-monohydrate, (MACROBID) 100 MG capsule Take 1 capsule (100 mg total) by mouth 2 (two) times daily.  . [DISCONTINUED] sertraline (ZOLOFT) 100 MG tablet Take 1 and 1/2 tab daily   No facility-administered encounter medications on file as of 02/09/2016.    No results found for this or any previous visit (from the past 2160 hour(s)).    Constitutional:  BP 110/64 mmHg  Pulse 57  Ht 5\' 7"  (1.702 m)  Wt 138 lb 6.4 oz (62.778 kg)  BMI 21.67 kg/m2  LMP 11/28/2012   Psychiatric Specialty Exam: Physical Exam  ROS  Blood pressure 110/64, pulse 57, height  (1.702 m), weight 138 lb 6.4 oz (62.778 kg), last menstrual period 11/28/2012.Body mass index is 21.67 kg/(m^2).  General Appearance: Casual  Eye Contact::  Fair  Speech:  Slow  Volume:  Decreased  Mood:  Anxious,  Depressed and Dysphoric  Affect:  Constricted  Thought Process:  Goal Directed  Orientation:  Full (Time, Place, and Person)  Thought Content:  Rumination  Suicidal Thoughts:  No  Homicidal Thoughts:  No  Memory:  Immediate;   Fair Recent;   Fair Remote;   Fair  Judgement:  Good  Insight:  Good  Psychomotor Activity:  Decreased  Concentration:  Fair  Recall:  Good  Fund of Knowledge:  Good  Language:  Good  Akathisia:  No  Handed:  Right  AIMS (if indicated):     Assets:  Communication Skills Desire for Improvement Financial Resources/Insurance Housing  ADL's:  Intact  Cognition:  WNL  Sleep:       Established Problem, Stable/Improving (1), Review of Psycho-Social Stressors (1), Review and summation of old records (2), Established Problem, Worsening (2), Review of Last Therapy Session (1), Independent Review of image, tracing or specimen (2) and Review of Medication Regimen & Side Effects (2)  Assessment: Axis I: Generalized anxiety disorder.   Major depressive disorder, recurrent moderate  Axis II: Deferred  Axis III:  Past Medical History  Diagnosis Date  . Anxiety   . Depression   . Fatigue   . Abnormal Pap smear of cervix ~2006     Plan:  Patient continues to have depressive symptoms.  I do believe we need to try a different medication.  I will discontinue Zoloft after cross titration with Lexapro.  She will start Lexapro 10 mg daily for 1 week and then 20 mg daily.  She will reduce Zoloft 50 mg only for 1 week and then she will stop the Zoloft.  One more time I encouraged to see therapist but patient declined. Discussed medication side effects and benefits.  Recommended to call us back if she has any question, concern or if she feels worsening of the symptom.  Discuss safety plan that anytime having active suicidal thoughts or homicidal.  Continue to call 911 or go to the local emergency room.  Follow-up in 4 weeks.  Jaime [name] T.,  MD 02/09/2016

## 2016-02-09 NOTE — Patient Instructions (Signed)
Reduced Zoloft to 50 mg daily for 1 week and than stop. Start Lexapro 1/2 tab daily for 1 week and than one tab daily

## 2016-02-10 ENCOUNTER — Ambulatory Visit (INDEPENDENT_AMBULATORY_CARE_PROVIDER_SITE_OTHER): Payer: 59 | Admitting: Certified Nurse Midwife

## 2016-02-10 ENCOUNTER — Encounter: Payer: Self-pay | Admitting: Certified Nurse Midwife

## 2016-02-10 VITALS — BP 100/64 | HR 72 | Resp 16 | Ht 66.25 in | Wt 135.0 lb

## 2016-02-10 DIAGNOSIS — Z01419 Encounter for gynecological examination (general) (routine) without abnormal findings: Secondary | ICD-10-CM

## 2016-02-10 NOTE — Patient Instructions (Signed)

## 2016-02-10 NOTE — Progress Notes (Signed)
50 y.o. G0P0000 Married  Caucasian Fe here for annual exam. Menopausal no HRT. Denies vaginal dryness or vaginal bleeding. Seeing Dr Artis Delay for anxiety and depression, not tolerating medication now and is weaning down to start new medication. Doing OK with this so far. Continues coconut oil with good results and uses Vagifem as needed, once every 2 weeks. Sees PCP Dr. Clelia Croft for aex and labs. No other health issues.   Patient's last menstrual period was 11/28/2012.          Sexually active: No.  The current method of family planning is post menopausal status.    Exercising: Yes.    zumba & walking 3 times a week Smoker:  no  Health Maintenance: Pap: 01-16-15 neg HPV HR neg MMG: 02-21-15 category c density,birads 1:neg will scheduled Colonoscopy: 07/2015 f/u 84yrs BMD:   none TDaP:  2015 Shingles: no Pneumonia: no Hep C and HIV: Hep C neg Labs: pcp Self breast exam: done monthly   reports that she has quit smoking. She has never used smokeless tobacco. She reports that she does not drink alcohol or use illicit drugs.  Past Medical History  Diagnosis Date  . Anxiety   . Depression   . Fatigue   . Abnormal Pap smear of cervix ~2006    Past Surgical History  Procedure Laterality Date  . Colposcopy  ~ 2006    Normal - per pt    Current Outpatient Prescriptions  Medication Sig Dispense Refill  . Calcium Carbonate-Vit D-Min (CALCIUM 1200 PO) Take by mouth.    . escitalopram (LEXAPRO) 20 MG tablet Take 1 tablet (20 mg total) by mouth daily. 30 tablet 0  . Estradiol (VAGIFEM) 10 MCG TABS vaginal tablet Insert nightly vaginally x 2 weeks then three times weekly 18 tablet 6  . Multiple Vitamin (MULTIVITAMIN) tablet Take 1 tablet by mouth daily.    . traZODone (DESYREL) 100 MG tablet Take 1 tablet (100 mg total) by mouth at bedtime. 30 tablet 1   No current facility-administered medications for this visit.    Family History  Problem Relation Age of Onset  . Adopted: Yes  . Family  history unknown: Yes    ROS:  Pertinent items are noted in HPI.  Otherwise, a comprehensive ROS was negative.  Exam:   LMP 11/28/2012   Ht Readings from Last 3 Encounters:  02/09/16  (1.702 m)  12/29/15  (1.702 m)  09/29/15  (1.702 m)    General appearance: alert, cooperative and appears stated age Head: Normocephalic, without obvious abnormality, atraumatic Neck: no adenopathy, supple, symmetrical, trachea midline and thyroid normal to inspection and palpation Lungs: clear to auscultation bilaterally Breasts: normal appearance, no masses or tenderness, No nipple retraction or dimpling, No nipple discharge or bleeding, No axillary or supraclavicular adenopathy Heart: regular rate and rhythm Abdomen: soft, non-tender; no masses,  no organomegaly Extremities: extremities normal, atraumatic, no cyanosis or edema Skin: Skin color, texture, turgor normal. No rashes or lesions Lymph nodes: Cervical, supraclavicular, and axillary nodes normal. No abnormal inguinal nodes palpated Neurologic: Grossly normal   Pelvic: External genitalia:  no lesions              Urethra:  normal appearing urethra with no masses, tenderness or lesions              Bartholin's and Skene's: normal                 Vagina: normal appearing vagina with normal color  and discharge, no lesions              Cervix: no cervical motion tenderness, no lesions and normal appearance              Pap taken: No. Bimanual Exam:  Uterus:  normal size, contour, position, consistency, mobility, non-tender              Adnexa: normal adnexa and no mass, fullness, tenderness               Rectovaginal: Confirms               Anus:  normal sphincter tone, no lesions  Chaperone present: yes  A:  Well Woman with normal exam  Menopausal no HRT  Atrophic Vaginitis with Vagifem periodically and coconut oil  Anxiety and depression with Psychiatrist management  P:   Reviewed health and wellness pertinent to  exam  Aware of need if vaginal bleeding to advise  Rx Vagifem see order  Pap smear as above not taken   counseled on breast self exam, mammography screening, use and side effects of HRT, adequate intake of calcium and vitamin D, diet and exercise  return annually or prn  An After Visit Summary was printed and given to the patient.

## 2016-02-11 NOTE — Progress Notes (Signed)
Reviewed personally.  M. Suzanne Jalen Oberry, MD.  

## 2016-03-16 ENCOUNTER — Other Ambulatory Visit (HOSPITAL_COMMUNITY): Payer: Self-pay

## 2016-03-16 DIAGNOSIS — F331 Major depressive disorder, recurrent, moderate: Secondary | ICD-10-CM

## 2016-03-16 MED ORDER — ESCITALOPRAM OXALATE 20 MG PO TABS: 20.0000 mg | ORAL_TABLET | Freq: Every day | ORAL | Status: DC

## 2016-03-25 ENCOUNTER — Other Ambulatory Visit (HOSPITAL_COMMUNITY): Payer: Self-pay

## 2016-03-25 ENCOUNTER — Ambulatory Visit (HOSPITAL_COMMUNITY): Payer: Self-pay | Admitting: Psychiatry

## 2016-03-25 DIAGNOSIS — F411 Generalized anxiety disorder: Secondary | ICD-10-CM

## 2016-03-25 MED ORDER — TRAZODONE HCL 100 MG PO TABS: 100.0000 mg | ORAL_TABLET | Freq: Every day | ORAL | 1 refills | Status: DC

## 2016-04-12 ENCOUNTER — Other Ambulatory Visit: Payer: Self-pay | Admitting: Certified Nurse Midwife

## 2016-04-12 DIAGNOSIS — Z1231 Encounter for screening mammogram for malignant neoplasm of breast: Secondary | ICD-10-CM

## 2016-04-16 ENCOUNTER — Other Ambulatory Visit (HOSPITAL_COMMUNITY): Payer: Self-pay

## 2016-04-16 DIAGNOSIS — F331 Major depressive disorder, recurrent, moderate: Secondary | ICD-10-CM

## 2016-04-16 MED ORDER — ESCITALOPRAM OXALATE 20 MG PO TABS: 20.0000 mg | ORAL_TABLET | Freq: Every day | ORAL | 0 refills | Status: DC

## 2016-04-19 ENCOUNTER — Ambulatory Visit
Admission: RE | Admit: 2016-04-19 | Discharge: 2016-04-19 | Disposition: A | Discharge status: Home / Self Care | Payer: 59 | Source: Ambulatory Visit | Attending: Certified Nurse Midwife | Admitting: Certified Nurse Midwife

## 2016-04-19 DIAGNOSIS — Z1231 Encounter for screening mammogram for malignant neoplasm of breast: Secondary | ICD-10-CM

## 2016-05-06 ENCOUNTER — Ambulatory Visit (HOSPITAL_COMMUNITY): Payer: Self-pay | Admitting: Psychiatry

## 2016-05-10 ENCOUNTER — Other Ambulatory Visit (HOSPITAL_COMMUNITY): Payer: Self-pay

## 2016-05-10 DIAGNOSIS — F331 Major depressive disorder, recurrent, moderate: Secondary | ICD-10-CM

## 2016-05-10 MED ORDER — ESCITALOPRAM OXALATE 20 MG PO TABS: 20.0000 mg | ORAL_TABLET | Freq: Every day | ORAL | 0 refills | Status: DC

## 2016-05-17 ENCOUNTER — Ambulatory Visit (HOSPITAL_COMMUNITY): Payer: Self-pay | Admitting: Psychiatry

## 2016-05-25 NOTE — Progress Notes (Signed)
[facility] Progress Note  Jaime [name] [id number] 50 y.o.  05/26/2016 3:34 PM  Chief Complaint:  "I feel much better"   History of Present Illness:  Jaime [name] came for her follow-up appointment.  On her last visit Zoloft was switched to Escitalopram.  Patient states that she feels much better since the last encounter. She does not have mood swings or crying spells anymore. She did have some nausea when she started escitalopram, but denies any symptoms these days. She occasionally feels anxious, but she uses skills learned from [place]. She has more energy, stating that she does not feel "lazy". She denies any insomnia and takes trazodone. 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 She denies SI. She denies AH/VH. She denies alcohol use or drug use.  Suicidal Ideation: No Plan Formed: No Patient has means to carry out plan: No  Homicidal Ideation: No Plan Formed: No Patient has means to carry out plan: No  Past Psychiatric History/Hospitalization(s) Per note by Dr. Lolly Mustache "Patient has history of depression and in the past given Wellbutrin and Prozac with limited response and cause weight gain.  She also tried amitriptyline which is given by her OB/GYN but her anxiety got worse.  Patient denies any history of mania, psychosis or any hallucination." Anxiety: Yes Bipolar Disorder: No Depression: Yes Mania: No Psychosis: No Schizophrenia: No Personality Disorder: No Hospitalization for psychiatric illness: No History of Electroconvulsive Shock Therapy: No Prior Suicide Attempts: No  Medical History; Patient has plantar fasciitis and recently vaginitis and diagnosed with menopause.  She had tried estrogen but it cause worsening of anxiety.  Her primary care physician is Dr. Cam Hai.   Active Ambulatory Problems    Diagnosis Date Noted  . Depression 08/28/2014  . Major depressive disorder, recurrent episode, moderate (HCC) 05/26/2016  . GAD  (generalized anxiety disorder) 05/26/2016   Resolved Ambulatory Problems    Diagnosis Date Noted  . No Resolved Ambulatory Problems   Past Medical History:  Diagnosis Date  . Abnormal Pap smear of cervix ~2006  . Anxiety   . Depression   . Fatigue     Family history; Patient does not know family history because she was adopted when only 76 days old.    Review of Systems: Psychiatric: Agitation: Irritability and emotional Hallucination: No Depressed Mood: Yes Insomnia: No Hypersomnia: No Altered Concentration: No Feels Worthless: No Grandiose Ideas: No Belief In Special Powers: No New/Increased Substance Abuse: No Compulsions: No  Neurologic: Headache: Yes Seizure: No Paresthesias: No   Musculoskeletal: Strength & Muscle Tone: within normal limits Gait & Station: normal Patient leans: N/A   Outpatient Encounter Prescriptions as of 05/26/2016  Medication Sig  . Calcium Carbonate-Vit D-Min (CALCIUM 1200 PO) Take by mouth.  . escitalopram (LEXAPRO) 20 MG tablet Take 1 tablet (20 mg total) by mouth daily.  . Estradiol (VAGIFEM) 10 MCG TABS vaginal tablet Insert nightly vaginally x 2 weeks then three times weekly (Patient taking differently: as needed. )  . Multiple Vitamin (MULTIVITAMIN) tablet Take 1 tablet by mouth daily.  . traZODone (DESYREL) 100 MG tablet Take 1 tablet (100 mg total) by mouth at bedtime.  . [DISCONTINUED] escitalopram (LEXAPRO) 20 MG tablet Take 1 tablet (20 mg total) by mouth daily.  . [DISCONTINUED] traZODone (DESYREL) 100 MG tablet Take 1 tablet (100 mg total) by mouth at bedtime.   No facility-administered encounter medications on file as of 05/26/2016.     No results found for this or  any previous visit (from the past 2160 hour(s)).    Constitutional:  BP 127/80 (BP Location: Right Arm, Patient Position: Sitting, Cuff Size: Normal)   Pulse 67   Resp 12   Ht 5\' 7"  (1.702 m)   Wt 143 lb 9.6 oz (65.1 kg)   LMP 11/28/2012   BMI 22.49  kg/m    Psychiatric Specialty Exam: Physical Exam  Review of Systems  Psychiatric/Behavioral: Negative for depression, substance abuse and suicidal ideas. The patient is nervous/anxious. The patient does not have insomnia.   All other systems reviewed and are negative.   Blood pressure 127/80, pulse 67, resp. rate 12, height 5\' 7"  (1.702 m), weight 143 lb 9.6 oz (65.1 kg), last menstrual period 11/28/2012.Body mass index is 22.49 kg/m.  General Appearance: Casual  Eye Contact::  Good  Speech:  Normal Rate  Volume:  Normal  Mood:  "good",   Affect:  Appropriate and Congruent reactive, smiles at times  Thought Process:  Goal Directed  Orientation:  Full (Time, Place, and Person)  Thought Content:  Rumination  Suicidal Thoughts:  No  Homicidal Thoughts:  No  Memory:  Immediate;   Fair Recent;   Fair Remote;   Fair  Judgement:  Good  Insight:  Good  Psychomotor Activity:  Normal  Concentration:  Fair  Recall:  Good  Fund of Knowledge:  Good  Language:  Good  Akathisia:  No  Handed:  Right  AIMS (if indicated):     Assets:  Communication Skills Desire for Improvement Financial Resources/Insurance Housing  ADL's:  Intact  Cognition:  WNL  Sleep:       Established Problem, Stable/Improving (1), Review of Psycho-Social Stressors (1), Review and summation of old records (2), Established Problem, Worsening (2), Review of Last Therapy Session (1), Independent Review of image, tracing or specimen (2) and Review of Medication Regimen & Side Effects (2)  Assessment: Jaime [name] is a 50 year old female with history of generalized anxiety disorder, depression who presented for follow up.   # GAD # MDD There has been significant improvement in her mood symptoms since switching from Zoloft to Escitalopram. Will continue current dose. Advised patient to take Trazodone prn if she continues to have insomnia.   Plan 1. Continue escitalopram 20 mg daily 2 Continue Trazodone  50-100 mg qhsprn for insomnia 3. Return to clinic in three months  The patient demonstrates the following  risk factors for suicide: Chronic risk factors for suicide include psychiatric disorder /depression, anxiety. Acute risk factors for suicide include none. Protective factors for this patient include positive social support, positive therapeutic relationship, coping skills, hope for the future.  Considering these factors, the overall suicide risk at this point appears to be low.   Neysa Hottereina Aasir Daigler, MD 05/26/2016

## 2016-05-26 ENCOUNTER — Encounter (HOSPITAL_COMMUNITY): Payer: Self-pay | Admitting: Psychiatry

## 2016-05-26 ENCOUNTER — Ambulatory Visit (INDEPENDENT_AMBULATORY_CARE_PROVIDER_SITE_OTHER): Payer: 59 | Admitting: Psychiatry

## 2016-05-26 DIAGNOSIS — F411 Generalized anxiety disorder: Secondary | ICD-10-CM | POA: Insufficient documentation

## 2016-05-26 DIAGNOSIS — F331 Major depressive disorder, recurrent, moderate: Secondary | ICD-10-CM | POA: Insufficient documentation

## 2016-05-26 MED ORDER — ESCITALOPRAM OXALATE 20 MG PO TABS: 20.0000 mg | ORAL_TABLET | Freq: Every day | ORAL | 2 refills | Status: DC

## 2016-05-26 MED ORDER — TRAZODONE HCL 100 MG PO TABS: 100.0000 mg | ORAL_TABLET | Freq: Every day | ORAL | 2 refills | Status: DC

## 2016-05-26 NOTE — Patient Instructions (Addendum)
1. Continue escitalopram 20 mg daily 2 Continue Trazodone 100 mg at night as needed for sleep 3. Return to clinic in three months

## 2016-06-15 ENCOUNTER — Encounter: Payer: Self-pay | Admitting: Psychiatry

## 2016-07-13 ENCOUNTER — Telehealth (HOSPITAL_COMMUNITY): Payer: Self-pay | Admitting: Psychiatry

## 2016-07-13 ENCOUNTER — Telehealth (HOSPITAL_COMMUNITY): Payer: Self-pay

## 2016-07-13 NOTE — Telephone Encounter (Signed)
I returned patient's phone call.  She is complaining of sleepiness and feeling tired in the morning when she takes the Lexapro.  She denies any suicidal thoughts.  I recommended to switch her Lexapro at bedtime and take Tylenol only if she cannot sleep.  I also advised her to call us back if she does not see any improvement.

## 2016-07-13 NOTE — Telephone Encounter (Signed)
Patient called and stated that she would like a call from you. She states that her depression is really bad right now and she does not see you until next month. Her call back number is [phone]. Thank you

## 2016-08-19 ENCOUNTER — Ambulatory Visit (HOSPITAL_COMMUNITY): Payer: Self-pay | Admitting: Psychiatry

## 2016-08-19 ENCOUNTER — Encounter (HOSPITAL_COMMUNITY): Payer: Self-pay | Admitting: Psychiatry

## 2016-08-19 ENCOUNTER — Ambulatory Visit (INDEPENDENT_AMBULATORY_CARE_PROVIDER_SITE_OTHER): Payer: 59 | Admitting: Psychiatry

## 2016-08-19 VITALS — BP 120/72 | HR 73 | Ht 67.0 in | Wt 150.6 lb

## 2016-08-19 DIAGNOSIS — Z880 Allergy status to penicillin: Secondary | ICD-10-CM | POA: Diagnosis not present

## 2016-08-19 DIAGNOSIS — Z87891 Personal history of nicotine dependence: Secondary | ICD-10-CM

## 2016-08-19 DIAGNOSIS — Z888 Allergy status to other drugs, medicaments and biological substances status: Secondary | ICD-10-CM | POA: Diagnosis not present

## 2016-08-19 DIAGNOSIS — F411 Generalized anxiety disorder: Secondary | ICD-10-CM

## 2016-08-19 DIAGNOSIS — Z79899 Other long term (current) drug therapy: Secondary | ICD-10-CM

## 2016-08-19 MED ORDER — LAMOTRIGINE 25 MG PO TABS
ORAL_TABLET | ORAL | 1 refills | Status: DC
Start: 2016-08-19 — End: 2016-10-07

## 2016-08-19 NOTE — Progress Notes (Signed)
BH MD/PA/NP OP Progress Note  08/19/2016 9:11 AM Jaime [name]  MRN:  [id number]  Chief Complaint:  Chief Complaint    Anxiety; Follow-up     Subjective:  I'm not doing good.  I have no energy and no motivation to do things.  I don't think my medicine is working.  HPI: Jaime [name] came for her follow-up appointment.  She is taking Lexapro 20 mg which was changed from Zoloft a few months ago.  She was last seen by covering psychiatrist but there were no changes in her Lexapro.  In the beginning starting Lexapro she felt some improvement but now she is feeling more anxious and have no energy.  She is easily tearful, crying, nervousness and anxious.  Though she denies any suicidal thoughts or homicidal thoughts but admitted feeling hopeless helpless and isolated.  She did not specify any triggers to cut she feels her job is going very well and no issues in her relationship.  Her husband is very supportive.  She sleeping good by taking half trazodone.  She denies any panic attack but admitted easily sensitive and emotional.  She admitted easily fatigued, lack of drive to do things and when she comes from work she sleeps and has no energy and concentration.  She denies any mania or any psychosis.  She is also concerned about side effects of Lexapro mainly weight gain and some time tingling in her hands.  Patient denies drinking alcohol or using any illegal substances.  Her appetite is increased since starting the Lexapro.  Her vital signs are stable.  Patient told that she had blood work in April at her primary care physician and her test was normal.  Visit Diagnosis:    ICD-9-CM ICD-10-CM   1. GAD (generalized anxiety disorder) 300.02 F41.1 lamoTRIgine (LAMICTAL) 25 MG tablet    Past Psychiatric History: Reviewed.  Patient has history of depression and in the past she had tried Wellbutrin, Prozac, Zoloft and amitriptyline.  Either she developed weight gain or medicine did not help her anxiety.   Patient denies any history of mania, psychosis, hallucination, suicidal thoughts or any psychiatric inpatient treatment.  Past Medical History:  Past Medical History:  Diagnosis Date  . Abnormal Pap smear of cervix ~2006  . Anxiety   . Depression   . Fatigue     Past Surgical History:  Procedure Laterality Date  . COLPOSCOPY  ~ 2006   Normal - per pt    Family Psychiatric History: See below.  Family History:  Family History  Problem Relation Age of Onset  . Adopted: Yes  . Family history unknown: Yes    Social History:  Social History   Social History  . Marital status: Married    Spouse name: N/A  . Number of children: N/A  . Years of education: N/A   Social History Main Topics  . Smoking status: Former Games developermoker  . Smokeless tobacco: Never Used  . Alcohol use No  . Drug use: No  . Sexual activity: No   Other Topics Concern  . None   Social History Narrative  . None    Allergies:  Allergies  Allergen Reactions  . Penicillins   . Amitriptyline Anxiety    No longer taking    Metabolic Disorder Labs: No results found for: HGBA1C, MPG No results found for: PROLACTIN No results found for: CHOL, TRIG, HDL, CHOLHDL, VLDL, LDLCALC   Current Medications: Current Outpatient Prescriptions  Medication Sig Dispense Refill  . Calcium Carbonate-Vit  D-Min (CALCIUM 1200 PO) Take by mouth.    . escitalopram (LEXAPRO) 20 MG tablet Take 1 tablet (20 mg total) by mouth daily. 30 tablet 2  . Estradiol (VAGIFEM) 10 MCG TABS vaginal tablet Insert nightly vaginally x 2 weeks then three times weekly (Patient taking differently: as needed. ) 18 tablet 6  . Multiple Vitamin (MULTIVITAMIN) tablet Take 1 tablet by mouth daily.    . traZODone (DESYREL) 100 MG tablet Take 1 tablet (100 mg total) by mouth at bedtime. 30 tablet 2  . lamoTRIgine (LAMICTAL) 25 MG tablet Take 1 tab daily for 1 week and than 2 tab daily 60 tablet 1   No current facility-administered medications for  this visit.     Neurologic: Headache: No Seizure: No Paresthesias: Tingling in her hand  Musculoskeletal: Strength & Muscle Tone: within normal limits Gait & Station: normal Patient leans: N/A  Psychiatric Specialty Exam: Review of Systems  Constitutional: Positive for malaise/fatigue.       Weight gain  HENT: Negative.   Eyes: Negative.   Respiratory: Negative.   Gastrointestinal: Negative.   Genitourinary: Negative.   Musculoskeletal: Negative.   Skin: Negative.   Neurological: Positive for tingling.  Psychiatric/Behavioral: The patient is nervous/anxious.     Blood pressure 120/72, pulse 73, height 5\' 7"  (1.702 m), weight 150 lb 9.6 oz (68.3 kg), last menstrual period 11/28/2012.Body mass index is 23.59 kg/m.  General Appearance: Casual  Eye Contact:  Fair  Speech:  Slow  Volume:  Normal  Mood:  Anxious and Easily tearful  Affect:  Constricted and Depressed  Thought Process:  Goal Directed  Orientation:  Full (Time, Place, and Person)  Thought Content: Rumination   Suicidal Thoughts:  No  Homicidal Thoughts:  No  Memory:  Immediate;   Fair Recent;   Good Remote;   Good  Judgement:  Good  Insight:  Good  Psychomotor Activity:  Normal  Concentration:  Concentration: Good and Attention Span: Fair  Recall:  [employer] of Knowledge: Good  Language: Good  Akathisia:  No  Handed:  Right  AIMS (if indicated):  None reported   Assets:  Communication Skills Desire for Improvement Financial Resources/Insurance Housing Resilience  ADL's:  Intact  Cognition: WNL  Sleep:  Good      Assessment ; Jaime [name] is 50 year old Caucasian, employed, married female who came for her follow-up appointment.  She has generalized anxiety disorder and depression.  She continues to have symptoms of anxiety .  Plan  I review her symptoms, collateral information from covering psychiatrist and past psychiatric history.  So far she had tried Wellbutrin, Prozac, Zoloft and recently  Lexapro but she continues to have symptoms of depression and anxiety.  I recommended to try Lamictal to help her anxiety, irritability and depression.  I would also get records from her primary care physician to rule out any organic cause for her fatigue and tiredness.  One more time I offered to see Tomma Lightning for counseling but patient does not feel that she need counseling at this time.  Discussed medication side effects especially if she developed a rash with the Lamictal that she needed to stop the medication immediately.  Patient was planed about cross titration of Lamictal and also gradually wean herself from Lexapro.  She will take Lamictal 25 mg daily for 1 week and then 50 mg daily.  She will reduce Lexapro 10 mg after 2 weeks and then stop after 2 weeks.  Discuss safety plan that anytime having active  suicidal thoughts or homicidal thoughts then she need to call 911 or go to the local emergency room.  Follow-up in 6 weeks.       Braelin Costlow T., MD 08/19/2016, 9:11 AM

## 2016-08-19 NOTE — Patient Instructions (Addendum)
Start Lamictal 25 mg daily for 1 week and than 2 tab daily.  Reduce Lexapro to 10 mg after two weeks and than stop after 2 weeks.

## 2016-09-18 ENCOUNTER — Other Ambulatory Visit (HOSPITAL_COMMUNITY): Payer: Self-pay | Admitting: Psychiatry

## 2016-09-18 DIAGNOSIS — F411 Generalized anxiety disorder: Secondary | ICD-10-CM

## 2016-09-20 ENCOUNTER — Other Ambulatory Visit (HOSPITAL_COMMUNITY): Payer: Self-pay | Admitting: Psychiatry

## 2016-09-20 DIAGNOSIS — F411 Generalized anxiety disorder: Secondary | ICD-10-CM

## 2016-09-20 MED ORDER — TRAZODONE HCL 100 MG PO TABS: 100.0000 mg | ORAL_TABLET | Freq: Every day | ORAL | 0 refills | Status: DC

## 2016-09-27 ENCOUNTER — Encounter: Payer: Self-pay | Admitting: Obstetrics and Gynecology

## 2016-09-27 ENCOUNTER — Ambulatory Visit (INDEPENDENT_AMBULATORY_CARE_PROVIDER_SITE_OTHER): Payer: 59 | Admitting: Obstetrics and Gynecology

## 2016-09-27 VITALS — BP 116/60 | HR 72 | Temp 97.6°F | Resp 16 | Ht 67.0 in | Wt 154.0 lb

## 2016-09-27 DIAGNOSIS — N952 Postmenopausal atrophic vaginitis: Secondary | ICD-10-CM

## 2016-09-27 DIAGNOSIS — R300 Dysuria: Secondary | ICD-10-CM | POA: Diagnosis not present

## 2016-09-27 DIAGNOSIS — R350 Frequency of micturition: Secondary | ICD-10-CM

## 2016-09-27 DIAGNOSIS — N898 Other specified noninflammatory disorders of vagina: Secondary | ICD-10-CM

## 2016-09-27 LAB — POCT URINALYSIS DIPSTICK
Bilirubin, UA: NEGATIVE
GLUCOSE UA: NEGATIVE
KETONES UA: NEGATIVE
Nitrite, UA: NEGATIVE
Protein, UA: NEGATIVE
Urobilinogen, UA: NEGATIVE
pH, UA: 6

## 2016-09-27 MED ORDER — SULFAMETHOXAZOLE-TRIMETHOPRIM 800-160 MG PO TABS: 1.0000 | ORAL_TABLET | Freq: Two times a day (BID) | ORAL | 0 refills | Status: DC

## 2016-09-27 NOTE — Progress Notes (Signed)
GYNECOLOGY  VISIT   HPI: 51 y.o.   Married  Caucasian  female   [id number] with Patient's last menstrual period was 11/28/2012.   here for  Possible UTI; frequent urination, urgency, pain or burning with urination, loss of urine spontaneously, loss of urine with sneeze or cough.  Symptoms started 2 days ago after a shower. Now feeling bilateral pelvic discomfort.  Taking AZO and cranberry, and still has burning.   Also having vaginal drainage.  No itching.  Has some odor.   No change in soap products.  Not sexually active.   Not using Vagifem any longer.  No further dryness.  Grateful to Sara Chuebbie Leonard for helping her with this.  Last UTI was one year ago.  Frequently has microscopic hematuria.  Saw urology 2 years ago and had normal imaging of kidneys and normal cystoscopy.   Recently has leakage with sneezing during this infection.   Urine dip showng trace leukocytes and 1+ blood today.  GYNECOLOGIC HISTORY: Patient's last menstrual period was 11/28/2012. Contraception:  Post menopausal Menopausal hormone therapy:  Estradiol Last mammogram:  04/19/16 BIRADS 1 negative Last pap smear:   01-16-15 neg HPV HR neg        OB History    Gravida Para Term Preterm AB Living   0 0 0 0 0 0   SAB TAB Ectopic Multiple Live Births   0 0 0 0           Patient Active Problem List   Diagnosis Date Noted  . Major depressive disorder, recurrent episode, moderate (HCC) 05/26/2016  . GAD (generalized anxiety disorder) 05/26/2016  . Depression 08/28/2014    Past Medical History:  Diagnosis Date  . Abnormal Pap smear of cervix ~2006  . Anxiety   . Depression   . Fatigue     Past Surgical History:  Procedure Laterality Date  . COLPOSCOPY  ~ 2006   Normal - per pt    Current Outpatient Prescriptions  Medication Sig Dispense Refill  . Calcium Carbonate-Vit D-Min (CALCIUM 1200 PO) Take by mouth.    . lamoTRIgine (LAMICTAL) 25 MG tablet Take 1 tab daily for 1 week and than 2 tab  daily (Patient taking differently: Take 2 tab daily) 60 tablet 1  . Multiple Vitamin (MULTIVITAMIN) tablet Take 1 tablet by mouth daily.    . Omega-3 Fatty Acids (FISH OIL PO) Take by mouth daily.    . traZODone (DESYREL) 100 MG tablet Take 1 tablet (100 mg total) by mouth at bedtime. 30 tablet 0   No current facility-administered medications for this visit.      ALLERGIES: Penicillins and Amitriptyline  Family History  Problem Relation Age of Onset  . Adopted: Yes  . Family history unknown: Yes    Social History   Social History  . Marital status: Married    Spouse name: N/A  . Number of children: N/A  . Years of education: N/A   Occupational History  . Not on file.   Social History Main Topics  . Smoking status: Former Games developermoker  . Smokeless tobacco: Never Used  . Alcohol use No  . Drug use: No  . Sexual activity: No   Other Topics Concern  . Not on file   Social History Narrative  . No narrative on file    ROS:  Pertinent items are noted in HPI.  PHYSICAL EXAMINATION:    BP 116/60 (BP Location: Right Arm, Patient Position: Sitting, Cuff Size: Normal)   Pulse 72  Temp 97.6 F (36.4 C) (Oral)   Resp 16   Ht 5\' 7"  (1.702 m)   Wt 154 lb (69.9 kg)   LMP 11/28/2012   BMI 24.12 kg/m     General appearance: alert, cooperative and appears stated age  Pelvic: External genitalia:  no lesions              Urethra:  normal appearing urethra with no masses, tenderness or lesions              Bartholins and Skenes: normal                 Vagina: normal appearing vagina with normal color and discharge, no lesions              Cervix: barely visualized.  Patient with pain with speculum exam - [place]. (Told me afterward that she usually has the adolescent speculum.)                Bimanual Exam:  Uterus:  normal size, contour, position, consistency, mobility, non-tender              Adnexa: no mass, fullness, tenderness     Chaperone was present for  exam.  ASSESSMENT  Dysuria.  Microscopic hematuria.  Vaginal atrophy.  Vaginal odor.   PLAN  Affirm done.  Urine micro and culture.  Bactrim DS po bid for 1 week.  Consider restarting Vagifem or vaginal estrogen cream.  We discussed options for care if she has pain preventing sexual activity.  She is not pursuing this at this time. Hydrate well.  Call if symptoms progress - increased pain, fever, frank blood in urine.   An After Visit Summary was printed and given to the patient.  _15_____ minutes face to face time of which over 50% was spent in counseling.

## 2016-09-28 LAB — URINALYSIS, MICROSCOPIC ONLY
Bacteria, UA: NONE SEEN [HPF]
CASTS: NONE SEEN [LPF]
Crystals: NONE SEEN [HPF]
RBC / HPF: NONE SEEN RBC/HPF (ref <=2)
Squamous Epithelial / LPF: NONE SEEN [HPF] (ref <=5)
WBC, UA: NONE SEEN WBC/HPF (ref <=5)
YEAST: NONE SEEN [HPF]

## 2016-09-28 LAB — WET PREP BY MOLECULAR PROBE
CANDIDA SPECIES: NEGATIVE
Gardnerella vaginalis: NEGATIVE
TRICHOMONAS VAG: NEGATIVE

## 2016-09-28 LAB — URINE CULTURE: Organism ID, Bacteria: NO GROWTH

## 2016-09-29 ENCOUNTER — Other Ambulatory Visit: Payer: Self-pay | Admitting: *Deleted

## 2016-09-29 ENCOUNTER — Telehealth: Payer: Self-pay | Admitting: *Deleted

## 2016-09-29 NOTE — Telephone Encounter (Signed)
-----   Message from Patton SallesBrook E Amundson C Silva, MD sent at 09/28/2016  7:01 PM EST ----- Please report the negative urine culture to patient.  She may stop the antibiotics. I suspect that her symptoms are due to vaginal atrophy, thinning of the tissues.  I would recommend she restart the Vagifem or try vaginal estrogen cream.  Please let me know if she needs refills or a new prescription.    Cc- Sara Chuebbie Leonard

## 2016-09-29 NOTE — Telephone Encounter (Signed)
Spoke with patient, advised as seen below per Dr. Edward JollySilva. Patient states she has #9/0RF of Vagifem. Patient sates she will need instructions clarified as she has 2 different instructions on previous prescriptions. Advised patient will review with Dr. Edward JollySilva and return call. Patient verbalizes understanding and is agreeable.  Dr. Edward JollySilva -please advise?

## 2016-09-29 NOTE — Telephone Encounter (Signed)
Spoke with patient. Advised of results and recommendations as seen below per Dr. Edward JollySilva. Patient states she is still experiencing urinary frequency, is there anything recommendations if this is not a UTI? Advised patient will review with Dr. Edward JollySilva for recommendations and return call. Patient states she is not at home to view what she has left of Vagifem, will return call later today to update if prescription is needed. Patient verbalizes understanding and is agreeable.   Dr. Edward JollySilva, please advise?

## 2016-09-29 NOTE — Telephone Encounter (Signed)
I recommend Vagifem (10 mcg), one tablet, per vagina at hs for 2 weeks.  Then reduce to one tablet per vaginal twice weekly.  Ok to give refills until annual exam is due with Sara Chuebbie Leonard in June. Have patient return for a recheck in 6 weeks with Debbie.  Cc- Sara Chuebbie Leonard

## 2016-09-29 NOTE — Telephone Encounter (Signed)
I would recommend returning to the vaginal estrogen treatment.  If the urinary frequency or incontinence persists, I recommend returning to discuss treatment options which may include medication and/or physical therapy.   Cc- Sara Chuebbie Leonard

## 2016-09-30 MED ORDER — ESTRADIOL 10 MCG VA TABS: ORAL_TABLET | VAGINAL | 4 refills | Status: DC

## 2016-09-30 NOTE — Telephone Encounter (Signed)
Spoke with patient, advised as seen below per Dr. Edward JollySilva. Patient states she is walking into work, will return call later today to schedule 6 week follow-up. Advised patient Vagifem order sent to pharmacy on file.  Patient verbalizes understanding and is agreeable.   Routing to provider for final review. Patient is agreeable to disposition. Will close encounter.

## 2016-10-05 ENCOUNTER — Telehealth (HOSPITAL_COMMUNITY): Payer: Self-pay

## 2016-10-05 NOTE — Telephone Encounter (Signed)
I returned patient's phone call.  She endorse she like Lamictal but yesterday she was very upset and tearful.  Recently her OB/GYN put her on estrogen because of vaginal dryness.  She is not sure if estrogen causing her more emotional.  I recommended to discuss with her OB/GYN and try to stop the estrogen few days to see if medicine causing side effects.  I also recommended to increase Lamictal 75 mg.  Patient has appointment on Thursday.

## 2016-10-05 NOTE — Telephone Encounter (Signed)
Patient is calling because she states that her medication is not working. Patient endorses crying spells, moodiness and says when she is aggravated she is "like a grizzly bear". Patient has an appointment with you on Thursday, she said that she would like to resolve this now as it is not fair to her husband. I did advise that patient that any medication changes that could be made would not take effect for at least a week or more and that she should wait for her appointment so that you can evaluate her. She was insistent, so I told her that I would talk to you and call her back. Please advise, thank you

## 2016-10-07 ENCOUNTER — Ambulatory Visit (INDEPENDENT_AMBULATORY_CARE_PROVIDER_SITE_OTHER): Payer: 59 | Admitting: Psychiatry

## 2016-10-07 ENCOUNTER — Telehealth: Payer: Self-pay | Admitting: Certified Nurse Midwife

## 2016-10-07 ENCOUNTER — Encounter (HOSPITAL_COMMUNITY): Payer: Self-pay | Admitting: Psychiatry

## 2016-10-07 DIAGNOSIS — Z880 Allergy status to penicillin: Secondary | ICD-10-CM | POA: Diagnosis not present

## 2016-10-07 DIAGNOSIS — Z79899 Other long term (current) drug therapy: Secondary | ICD-10-CM

## 2016-10-07 DIAGNOSIS — Z888 Allergy status to other drugs, medicaments and biological substances status: Secondary | ICD-10-CM

## 2016-10-07 DIAGNOSIS — F411 Generalized anxiety disorder: Secondary | ICD-10-CM | POA: Diagnosis not present

## 2016-10-07 DIAGNOSIS — Z87891 Personal history of nicotine dependence: Secondary | ICD-10-CM | POA: Diagnosis not present

## 2016-10-07 MED ORDER — LAMOTRIGINE 100 MG PO TABS: 100.0000 mg | ORAL_TABLET | Freq: Every day | ORAL | 2 refills | Status: DC

## 2016-10-07 MED ORDER — TRAZODONE HCL 100 MG PO TABS: 100.0000 mg | ORAL_TABLET | Freq: Every day | ORAL | 1 refills | Status: DC

## 2016-10-07 NOTE — Telephone Encounter (Signed)
The estrogen effects the metabolism of her medication, which may decrease circulating amount of drug. If she only uses Vagifem once weekly she may not notice the difference. She can just discontinue and use her coconut oil daily and see if this works well for . If problems she can let us know.

## 2016-10-07 NOTE — Telephone Encounter (Signed)
Spoke with patient. Patient states she started vagifem 1 week ago and it is conflicting with her antidepressant. Patient states crying spells have increased over last week and reports a "really bad one" on Monday. Patient states she was seen by her psychiatrist today and was recommended to stop the vagifem. Patient states she has been taking Lamotrigine 100 mg for the last month. Patient would like to know if there is an alternative to the vagifem? Advised patient would review with Leota Sauerseborah Leonard, CNM and return call with recommendations, patient is agreeable.  Leota Sauerseborah Leonard, CNM -please advise?

## 2016-10-07 NOTE — Telephone Encounter (Signed)
Patient wants to speak with the nurse no information given. °

## 2016-10-07 NOTE — Telephone Encounter (Signed)
Spoke with patient, advised as seen below per Deborah Leonard, CNM. Patient verbalizes understanding and is agreeable.   Routing to provider for final review. Patient is agreeable to disposition. Will close encounter.  

## 2016-10-07 NOTE — Progress Notes (Signed)
BH MD/PA/NP OP Progress Note  10/07/2016 9:49 AM Jaime [name]  MRN:  [id number]  Chief Complaint:  Chief Complaint    Follow-up     Subjective:  I'm doing better since I stop using estrogen cream.  I'm less emotional.  HPI: Jaime [name] came for her follow-up appointment.  On her last visit we started her on Lamictal and she felt much better but then she was prescribed estrogen cream for her vaginal dryness and she has noticed having crying spells , very emotional and tearfulness.  She stopped using estrogen cream which was recommended on the phone as patient was complaining of crying spells, very emotional and tearful since she was prescribed for vaginal dryness.  She's also taking Lamictal 75 mg.  She is feeling much better.  She had to be like the Lamictal.  She is less depressed and she sleeping better.  In the past she had tried Zoloft and Lexapro but it was discontinued after lack of efficacy.  Overall she feels more energy, more motivation and able to do things.  She is also sleeping good with the trazodone.  She denies any rash, itching, irritability, anger, mania or any psychosis.  We received blood work and records from his primary care physician Dr. Sherryll BurgerShah.  On June 17 she had blood drawn and her basic chemistry is normal.  Her creatinine was 0.92, sodium 140, potassium 4.1 and her LFTs are normal.  Her vitamin D 52.2, cholesterol 215 LDL 130.  Patient has no tremors or shakes.  She like to continue Lamictal and trazodone.  Patient denies drinking alcohol or using any illegal substances.  Visit Diagnosis:    ICD-9-CM ICD-10-CM   1. GAD (generalized anxiety disorder) 300.02 F41.1 lamoTRIgine (LAMICTAL) 100 MG tablet     traZODone (DESYREL) 100 MG tablet    Past Psychiatric History: Patient has history of depression and in the past she had tried Wellbutrin, Prozac, Zoloft, amitriptyline and lately Lexapro.  Patient denies any history of mania, psychosis, hallucination, suicidal attempt or  any psychiatric inpatient treatment.  Past Medical History:  Her [facility] care [facility]. Past Medical History:  Diagnosis Date  . Abnormal Pap smear of cervix ~2006  . Anxiety   . Depression   . Fatigue     Past Surgical History:  Procedure Laterality Date  . COLPOSCOPY  ~ 2006   Normal - per pt    Family Psychiatric History: Reviewed.  Family History:  Family History  Problem Relation Age of Onset  . Adopted: Yes  . Family history unknown: Yes    Social History:  Social History   Social History  . Marital status: Married    Spouse name: N/A  . Number of children: N/A  . Years of education: N/A   Social History Main Topics  . Smoking status: Former Games developermoker  . Smokeless tobacco: Never Used  . Alcohol use No  . Drug use: No  . Sexual activity: Yes    Partners: Male    Birth control/ protection: Post-menopausal   Other Topics Concern  . None   Social History Narrative  . None    Allergies:  Allergies  Allergen Reactions  . Penicillins   . Amitriptyline Anxiety    No longer taking    Metabolic Disorder Labs: No results found for: HGBA1C, MPG No results found for: PROLACTIN No results found for: CHOL, TRIG, HDL, CHOLHDL, VLDL, LDLCALC   Current Medications: Current Outpatient Prescriptions  Medication  Sig Dispense Refill  . Calcium Carbonate-Vit D-Min (CALCIUM 1200 PO) Take by mouth.    . lamoTRIgine (LAMICTAL) 100 MG tablet Take 1 tablet (100 mg total) by mouth daily. 30 tablet 2  . Multiple Vitamin (MULTIVITAMIN) tablet Take 1 tablet by mouth daily.    . Omega-3 Fatty Acids (FISH OIL PO) Take by mouth daily.    . traZODone (DESYREL) 100 MG tablet Take 1 tablet (100 mg total) by mouth at bedtime. 30 tablet 1   No current facility-administered medications for this visit.     Neurologic: Headache: No Seizure: No Paresthesias: No  Musculoskeletal: Strength & Muscle Tone: within normal limits Gait &  Station: normal Patient leans: N/A  Psychiatric Specialty Exam: Review of Systems  Constitutional: Negative.   HENT: Negative.   Respiratory: Negative.   Cardiovascular: Negative.   Gastrointestinal: Negative.   Genitourinary: Negative.        Patient complained of vaginal dryness  Musculoskeletal: Negative.   Skin: Negative for itching and rash.  Neurological: Negative.     Blood pressure 132/82, pulse 63, height 5\' 7"  (1.702 m), weight 155 lb 6.4 oz (70.5 kg), last menstrual period 11/28/2012, SpO2 99 %.Body mass index is 24.34 kg/m.  General Appearance: Casual  Eye Contact:  Good  Speech:  Clear and Coherent  Volume:  Normal  Mood:  Euthymic  Affect:  Appropriate  Thought Process:  Goal Directed  Orientation:  Full (Time, Place, and Person)  Thought Content: WDL and Logical   Suicidal Thoughts:  No  Homicidal Thoughts:  No  Memory:  Immediate;   Good Recent;   Good Remote;   Good  Judgement:  Good  Insight:  Good  Psychomotor Activity:  EPS  Concentration:  Concentration: Good and Attention Span: Good  Recall:  Good  Fund of Knowledge: Good  Language: Good  Akathisia:  No  Handed:  Right  AIMS (if indicated):  0  Assets:  Communication Skills Desire for Improvement Financial Resources/Insurance Housing Leisure Time  ADL's:  Intact  Cognition: WNL  Sleep:  Good     Assessment: Major depressive disorder, recurrent.  Generalized anxiety disorder  Plan: I reviewed records from her primary care physician, psychosocial stressors, current medication.  Since she stopped estrogen cream she is feeling better.  She is less emotional.  I recommended to increase Lamictal 100 mg daily and continue trazodone 100 mg half to one tablet at bedtime.  I reminded that she should call her OB/GYN to discuss the possible side effects from estrogen cream and if needed many to try a different medication.  Patient has no rash, itching, tremors.  Recommended to call us back if she has  any question, concern if she feels worsening of the symptom.  Follow-up in 3 months.     Lakeem Rozo T., MD 10/07/2016, 9:49 AM

## 2016-10-15 ENCOUNTER — Other Ambulatory Visit (HOSPITAL_COMMUNITY): Payer: Self-pay | Admitting: Psychiatry

## 2016-10-15 DIAGNOSIS — F411 Generalized anxiety disorder: Secondary | ICD-10-CM

## 2016-11-09 ENCOUNTER — Encounter: Payer: Self-pay | Admitting: Certified Nurse Midwife

## 2016-11-09 ENCOUNTER — Ambulatory Visit (INDEPENDENT_AMBULATORY_CARE_PROVIDER_SITE_OTHER): Payer: 59 | Admitting: Certified Nurse Midwife

## 2016-11-09 VITALS — BP 110/60 | HR 68 | Resp 16 | Ht 67.0 in | Wt 156.0 lb

## 2016-11-09 DIAGNOSIS — R300 Dysuria: Secondary | ICD-10-CM | POA: Diagnosis not present

## 2016-11-09 DIAGNOSIS — B373 Candidiasis of vulva and vagina: Secondary | ICD-10-CM | POA: Diagnosis not present

## 2016-11-09 DIAGNOSIS — B3731 Acute candidiasis of vulva and vagina: Secondary | ICD-10-CM

## 2016-11-09 DIAGNOSIS — N952 Postmenopausal atrophic vaginitis: Secondary | ICD-10-CM | POA: Diagnosis not present

## 2016-11-09 LAB — POCT URINALYSIS DIPSTICK
BILIRUBIN UA: NEGATIVE
Glucose, UA: NEGATIVE
Ketones, UA: NEGATIVE
LEUKOCYTES UA: NEGATIVE
NITRITE UA: NEGATIVE
PH UA: 7
PROTEIN UA: NEGATIVE
UROBILINOGEN UA: NEGATIVE — AB

## 2016-11-09 MED ORDER — TERCONAZOLE 0.4 % VA CREA: 1.0000 | TOPICAL_CREAM | Freq: Every day | VAGINAL | 0 refills | Status: DC

## 2016-11-09 NOTE — Patient Instructions (Signed)

## 2016-11-09 NOTE — Progress Notes (Signed)
51 y.o. Married Caucasian female G0P0000 here for follow up of Vagifem re- start  with  initiated on 09/27/16. Used Vagifem as instructed, but started having mood changes and stopped the Vagifem  Emotionally feels better. Now having burning in vagina and ? Burning with urination.  Will not restart estrogen for vaginal use. No new personal products. Not sexual active until this resolves. Some slight increase in redness, and some vaginal odor. No other issues today.    O: Healthy WD,WN female Affect:  normal Skin: warm and dry Abdomen:soft, non tender, negative suprapubic Pelvic exam:EXTERNAL GENITALIA: normal appearing vulva with no masses, tenderness or lesions Urethra, Bladder, Urethral meatus non tender VAGINA: slight atrophic appearance with redness at introitus, slightly tender scant discharge with odor wet prep taken CERVIX: no lesions or cervical motion tenderness UTERUS: normal, no masses or tenderness ADNEXA: no masses palpable and nontender RECTUM: no dryness noted at opening  A:Yeast vaginitis Atrophic vaginitis with emotional changes with Vagifem use Depression on medication with MD management R/O UTI  POCT urine : Trace blood Wet prep: KOH,Saline + yeast  P: Discussed findings of yeast vaginitis and need to treat. Discussed maybe the cause of discomfort in addition to dryness. Discussed Aveeno sitz bath use prn comfort. Rx Terazol 7 cream with instructions see order Restart coconut oil in am and Terazol in pm. Advise of status once Terazol completed. Continue MD follow up with depression. Warning signs of UTI given and need to increase water intake. Patient will advise if changes occur. Lab: Urine culture  Rv prn   Labs   Instructions given regarding:  RV

## 2016-11-10 LAB — URINE CULTURE: ORGANISM ID, BACTERIA: NO GROWTH

## 2016-11-15 ENCOUNTER — Telehealth: Payer: Self-pay | Admitting: Certified Nurse Midwife

## 2016-11-15 MED ORDER — FLUCONAZOLE 150 MG PO TABS
150.0000 mg | ORAL_TABLET | Freq: Once | ORAL | 0 refills | Status: AC
Start: 2016-11-15 — End: 2016-11-15

## 2016-11-15 MED ORDER — FLUCONAZOLE 150 MG PO TABS: 150.0000 mg | ORAL_TABLET | Freq: Once | ORAL | 0 refills | Status: DC

## 2016-11-15 NOTE — Telephone Encounter (Signed)
Spoke with patient. Advised of message as seen below from Dr.Silva. Patient is agreeable and verbalizes understanding. Rx for Diflucan 150 mg po x 1, repeat in 72 hours if symptoms persist #2 0RF sent to pharmacy on file.  CC: Jaime [name] CNM   Routing to covering provider for final review. Patient agreeable to disposition. Will close encounter.

## 2016-11-15 NOTE — Telephone Encounter (Signed)
OK for Diflucan 150 mg po x 1.  May repeat in 72 hours if symptoms persist.  Disp:  2,  RF none.  If symptoms persist, please have her return for office visit.  I am not sure to what extent her symptoms are due to atrophy.   Cc- Sara Chuebbie Leonard.

## 2016-11-15 NOTE — Progress Notes (Signed)
Encounter reviewed Jaime Bergum, MD   

## 2016-11-15 NOTE — Telephone Encounter (Signed)
Left message to call Hilary Milks at [phone]. 

## 2016-11-15 NOTE — Telephone Encounter (Signed)
Spoke with patient. Patient was seen on 11/09/2016 with Jaime [name] CNM for vaginal burning, redness, and odor. Was started on Terazol 7. Patient is using the medication once per day. Reports she has been unable to place the applicator due to vaginal irritation. Has been using her finger to insert the medication, States that her symptoms have not changed since she was seen. Patient had a urine culture performed  On 11/09/2016 that was negative. Patient is asking for recommendations on what she may try or next steps since symptoms have not changed. Advised will review with covering provider and return call with further recommendations. Patient is agreeable.

## 2016-11-15 NOTE — Telephone Encounter (Signed)
Patient states medication she was given is not helping.  No other information given

## 2016-12-10 ENCOUNTER — Other Ambulatory Visit (HOSPITAL_COMMUNITY): Payer: Self-pay | Admitting: Psychiatry

## 2016-12-10 DIAGNOSIS — F411 Generalized anxiety disorder: Secondary | ICD-10-CM

## 2017-01-05 ENCOUNTER — Encounter (HOSPITAL_COMMUNITY): Payer: Self-pay | Admitting: Psychiatry

## 2017-01-05 ENCOUNTER — Ambulatory Visit (INDEPENDENT_AMBULATORY_CARE_PROVIDER_SITE_OTHER): Payer: 59 | Admitting: Psychiatry

## 2017-01-05 DIAGNOSIS — F411 Generalized anxiety disorder: Secondary | ICD-10-CM | POA: Diagnosis not present

## 2017-01-05 DIAGNOSIS — Z79899 Other long term (current) drug therapy: Secondary | ICD-10-CM

## 2017-01-05 DIAGNOSIS — F332 Major depressive disorder, recurrent severe without psychotic features: Secondary | ICD-10-CM | POA: Diagnosis not present

## 2017-01-05 MED ORDER — TRAZODONE HCL 100 MG PO TABS: 100.0000 mg | ORAL_TABLET | Freq: Every day | ORAL | 1 refills | Status: DC

## 2017-01-05 MED ORDER — LAMOTRIGINE 100 MG PO TABS: 100.0000 mg | ORAL_TABLET | Freq: Every day | ORAL | 2 refills | Status: DC

## 2017-01-05 NOTE — Progress Notes (Signed)
BH MD/PA/NP OP Progress Note  01/05/2017 8:48 AM Jaime [name]  MRN:  [id number]  Chief Complaint:  Subjective:  I am doing good.  HPI: Jaime [name] came for her follow-up appointment.  She is taking her medication and denies any side effects.  Last week she attended her niece wedding and she had a very good time.  She denies any anxiety or panic attack.  She denies any crying spells.  She is taking Lamictal 100 mg is helping her depression and crying spells.  She is working up to 25 hours a week and really like her job.  She sleeping good and she is more motivated and social.  She denies any nervousness or any feeling of hopelessness or worthlessness.  She denies any mania or any psychosis.  She is scheduled to see her primary care physician in June.  Patient has no rash, itching, shakes or any tremors.  She elected continue Lamictal 100 mg daily and trazodone 100 mg half to one tablet as needed.  Her appetite is okay.  Her vital signs are stable.  Visit Diagnosis:    ICD-9-CM ICD-10-CM   1. GAD (generalized anxiety disorder) 300.02 F41.1 traZODone (DESYREL) 100 MG tablet     lamoTRIgine (LAMICTAL) 100 MG tablet    Past Psychiatric History:  Patient has history of depression and in the past she had tried Wellbutrin, Prozac, Zoloft, amitriptyline and Lexapro.  She denies any history of mania, psychosis, hallucination, suicidal attempt or any inpatient psychiatric treatment.  Past Medical History:  Past Medical History:  Diagnosis Date  . Abnormal Pap smear of cervix ~2006  . Anxiety   . Depression   . Fatigue     Past Surgical History:  Procedure Laterality Date  . COLPOSCOPY  ~ 2006   Normal - per pt    Family Psychiatric History: Reviewed.  Family History:  Family History  Problem Relation Age of Onset  . Adopted: Yes  . Family history unknown: Yes    Social History:  Social History   Social History  . Marital status: Married    Spouse name: N/A  . Number of children:  N/A  . Years of education: N/A   Social History Main Topics  . Smoking status: Former Games developer  . Smokeless tobacco: Never Used  . Alcohol use No  . Drug use: No  . Sexual activity: No   Other Topics Concern  . Not on file   Social History Narrative  . No narrative on file    Allergies:  Allergies  Allergen Reactions  . Penicillins   . Amitriptyline Anxiety    No longer taking    Metabolic Disorder Labs: On February 14, 2016 she had blood drawn and her basic chemistry is normal.  Her creatinine was 0.92, sodium 140, potassium 4.1 and her LFTs are normal.  Her vitamin D 52.2, cholesterol 215 LDL 130.  Her primary care physician is Dr. Clelia Croft. No results found for: HGBA1C, MPG No results found for: PROLACTIN No results found for: CHOL, TRIG, HDL, CHOLHDL, VLDL, LDLCALC   Current Medications: Current Outpatient Prescriptions  Medication Sig Dispense Refill  . Calcium Carbonate-Vit D-Min (CALCIUM 1200 PO) Take by mouth.    . lamoTRIgine (LAMICTAL) 100 MG tablet Take 1 tablet (100 mg total) by mouth daily. 30 tablet 2  . Multiple Vitamin (MULTIVITAMIN) tablet Take 1 tablet by mouth daily.    . Omega-3 Fatty Acids (FISH OIL PO) Take by mouth daily.    Marland Kitchen terconazole (TERAZOL  7) 0.4 % vaginal cream Place 1 applicator vaginally at bedtime. 45 g 0  . traZODone (DESYREL) 100 MG tablet Take 1 tablet (100 mg total) by mouth at bedtime. 30 tablet 1   No current facility-administered medications for this visit.     Neurologic: Headache: No Seizure: No Paresthesias: No  Musculoskeletal: Strength & Muscle Tone: within normal limits Gait & Station: normal Patient leans: N/A  Psychiatric Specialty Exam: ROS  Blood pressure 110/78, pulse 68, height 5' 7.5" (1.715 m), weight 152 lb (68.9 kg), last menstrual period 11/28/2012.There is no height or weight on file to calculate BMI.  General Appearance: Casual  Eye Contact:  Good  Speech:  Clear and Coherent  Volume:  Normal  Mood:   Euthymic  Affect:  Congruent  Thought Process:  Goal Directed  Orientation:  Full (Time, Place, and Person)  Thought Content: WDL and Logical   Suicidal Thoughts:  No  Homicidal Thoughts:  No  Memory:  Immediate;   Good Recent;   Good Remote;   Good  Judgement:  Good  Insight:  Good  Psychomotor Activity:  Normal  Concentration:  Concentration: Good and Attention Span: Good  Recall:  Good  Fund of Knowledge: Good  Language: Good  Akathisia:  No  Handed:  Right  AIMS (if indicated):  0  Assets:  Communication Skills Desire for Improvement Housing Physical Health Resilience Social Support Talents/Skills Transportation  ADL's:  Intact  Cognition: WNL  Sleep:  Good    Assessment: Generalized anxiety disorder.  Major depressive disorder, recurrent.  Plan: Patient is a stable on Lamictal 100 mg daily and trazodone 100 mg at bedtime.  She has no side effects including any rash, itching, tremors or any shakes.  Discussed medication side effects and benefits.  Recommended to call us back if she has any question, concern or if she feels worsening of the symptom.  Follow-up in 3 months.  ARFEEN,SYED T., MD 01/05/2017, 8:48 AM

## 2017-02-09 NOTE — Progress Notes (Signed)
51 y.o. G0P0000 Married  Caucasian Fe here for annual exam. Menopausal no HRT. Denies vaginal bleeding. Continues with coconut oil for vaginal dryness working so much better than Vagifem trial. No vaginal pain or pain with sexual activity. Sees PCP Dr. Clelia CroftShaw once yearly for labs. Dr Lucia GaskinsAhern manages depression medication, all stable. No health issues today. Planning vacation later in year!    Patient's last menstrual period was 11/28/2012.          Sexually active: No.  The current method of family planning is post menopausal status.    Exercising: Yes.    walking Smoker:  no  Health Maintenance: Pap:  01-16-15 neg HPV HR neg History of Abnormal Pap: yes MMG:  04-19-16 category c density birads 1:neg Self Breast exams: yes Colonoscopy: 12/16 f/u 5646yrs BMD:   none  TDaP:  2015 Shingles: no Pneumonia: no Hep C and HIV: not done Labs: none   reports that she has quit smoking. She has never used smokeless tobacco. She reports that she does not drink alcohol or use drugs.  Past Medical History:  Diagnosis Date  . Abnormal Pap smear of cervix ~2006  . Anxiety   . Depression   . Fatigue     Past Surgical History:  Procedure Laterality Date  . COLPOSCOPY  ~ 2006   Normal - per pt    Current Outpatient Prescriptions  Medication Sig Dispense Refill  . Calcium Carbonate-Vit D-Min (CALCIUM 1200 PO) Take by mouth.    . lamoTRIgine (LAMICTAL) 100 MG tablet Take 1 tablet (100 mg total) by mouth daily. 30 tablet 2  . Multiple Vitamin (MULTIVITAMIN) tablet Take 1 tablet by mouth daily.    . Omega-3 Fatty Acids (FISH OIL PO) Take by mouth daily.    Marland Kitchen. terconazole (TERAZOL 7) 0.4 % vaginal cream Place 1 applicator vaginally at bedtime. 45 g 0  . traZODone (DESYREL) 100 MG tablet Take 1 tablet (100 mg total) by mouth at bedtime. 30 tablet 1   No current facility-administered medications for this visit.     Family History  Problem Relation Age of Onset  . Adopted: Yes  . Family history  unknown: Yes    ROS:  Pertinent items are noted in HPI.  Otherwise, a comprehensive ROS was negative.  Exam:   LMP 11/28/2012    Ht Readings from Last 3 Encounters:  11/09/16 5\' 7"  (1.702 m)  09/27/16 5\' 7"  (1.702 m)  02/10/16 5' 6.25" (1.683 m)    General appearance: alert, cooperative and appears stated age Head: Normocephalic, without obvious abnormality, atraumatic Neck: no adenopathy, supple, symmetrical, trachea midline and thyroid normal to inspection and palpation and non-palpable Lungs: clear to auscultation bilaterally Breasts: normal appearance, no masses or tenderness, No nipple retraction or dimpling, No nipple discharge or bleeding, No axillary or supraclavicular adenopathy Heart: regular rate and rhythm Abdomen: soft, non-tender; no masses,  no organomegaly Extremities: extremities normal, atraumatic, no cyanosis or edema Skin: Skin color, texture, turgor normal. No rashes or lesions Lymph nodes: Cervical, supraclavicular, and axillary nodes normal. No abnormal inguinal nodes palpated Neurologic: Grossly normal   Pelvic: External genitalia:  no lesions              Urethra:  normal appearing urethra with no masses, tenderness or lesions              Bartholin's and Skene's: normal                 Vagina: normal appearing vagina  with normal color and discharge, no lesions              Cervix: no cervical motion tenderness, no lesions and nulliparous appearance              Pap taken: Yes.   Bimanual Exam:  Uterus:  normal size, contour, position, consistency, mobility, non-tender              Adnexa: normal adnexa and no mass, fullness, tenderness               Rectovaginal: Confirms               Anus:  normal sphincter tone, no lesions  Chaperone present: yes  A:  Well Woman with normal exam  Menopausal no HRT  Atrophic vaginitis coconut oil working well  Depression with MD management  P:   Reviewed health and wellness pertinent to exam  Aware of need to  advise if vaginal bleeding  Continue coconut oil use as discussed.  Continue MD follow up as dicussed.  Pap smear: yes   counseled on breast self exam, mammography screening, adequate intake of calcium and vitamin D, diet and exercise  return annually or prn  An After Visit Summary was printed and given to the patient.

## 2017-02-10 ENCOUNTER — Encounter: Payer: Self-pay | Admitting: Certified Nurse Midwife

## 2017-02-10 ENCOUNTER — Ambulatory Visit (INDEPENDENT_AMBULATORY_CARE_PROVIDER_SITE_OTHER): Payer: 59 | Admitting: Certified Nurse Midwife

## 2017-02-10 ENCOUNTER — Other Ambulatory Visit (HOSPITAL_COMMUNITY)
Admission: RE | Admit: 2017-02-10 | Discharge: 2017-02-10 | Disposition: A | Discharge status: Home / Self Care | Payer: 59 | Source: Ambulatory Visit | Attending: Obstetrics and Gynecology | Admitting: Obstetrics and Gynecology

## 2017-02-10 VITALS — BP 110/70 | HR 64 | Resp 16 | Ht 66.5 in | Wt 148.0 lb

## 2017-02-10 DIAGNOSIS — Z01419 Encounter for gynecological examination (general) (routine) without abnormal findings: Secondary | ICD-10-CM | POA: Insufficient documentation

## 2017-02-10 DIAGNOSIS — Z124 Encounter for screening for malignant neoplasm of cervix: Secondary | ICD-10-CM

## 2017-02-10 DIAGNOSIS — N952 Postmenopausal atrophic vaginitis: Secondary | ICD-10-CM

## 2017-02-10 NOTE — Patient Instructions (Signed)

## 2017-02-14 LAB — CYTOLOGY - PAP: Diagnosis: NEGATIVE

## 2017-03-01 DIAGNOSIS — Z0000 Encounter for general adult medical examination without abnormal findings: Secondary | ICD-10-CM | POA: Diagnosis not present

## 2017-03-01 DIAGNOSIS — E7800 Pure hypercholesterolemia, unspecified: Secondary | ICD-10-CM | POA: Diagnosis not present

## 2017-03-03 ENCOUNTER — Other Ambulatory Visit (HOSPITAL_COMMUNITY): Payer: Self-pay | Admitting: Psychiatry

## 2017-03-03 DIAGNOSIS — F411 Generalized anxiety disorder: Secondary | ICD-10-CM

## 2017-03-27 DIAGNOSIS — R197 Diarrhea, unspecified: Secondary | ICD-10-CM | POA: Diagnosis not present

## 2017-03-27 DIAGNOSIS — R1084 Generalized abdominal pain: Secondary | ICD-10-CM | POA: Diagnosis not present

## 2017-04-02 ENCOUNTER — Other Ambulatory Visit (HOSPITAL_COMMUNITY): Payer: Self-pay | Admitting: Psychiatry

## 2017-04-02 DIAGNOSIS — F411 Generalized anxiety disorder: Secondary | ICD-10-CM

## 2017-04-07 ENCOUNTER — Encounter (HOSPITAL_COMMUNITY): Payer: Self-pay | Admitting: Psychiatry

## 2017-04-07 ENCOUNTER — Ambulatory Visit (INDEPENDENT_AMBULATORY_CARE_PROVIDER_SITE_OTHER): Payer: 59 | Admitting: Psychiatry

## 2017-04-07 DIAGNOSIS — Z87891 Personal history of nicotine dependence: Secondary | ICD-10-CM

## 2017-04-07 DIAGNOSIS — F332 Major depressive disorder, recurrent severe without psychotic features: Secondary | ICD-10-CM | POA: Diagnosis not present

## 2017-04-07 DIAGNOSIS — F411 Generalized anxiety disorder: Secondary | ICD-10-CM | POA: Diagnosis not present

## 2017-04-07 MED ORDER — LAMOTRIGINE 100 MG PO TABS: 100.0000 mg | ORAL_TABLET | Freq: Every day | ORAL | 0 refills | Status: DC

## 2017-04-07 MED ORDER — TRAZODONE HCL 100 MG PO TABS: 100.0000 mg | ORAL_TABLET | Freq: Every day | ORAL | 0 refills | Status: DC

## 2017-04-07 NOTE — Progress Notes (Signed)
BH MD/PA/NP OP Progress Note  04/07/2017 8:40 AM Jaime [name]  MRN:  [id number]  Chief Complaint:  Subjective:  I'm taking medication.  I'm feeling better.  HPI: Jaime CornfieldStephanie came for her follow-up appointment.  She is compliant with the medication and reported no side effects.  She sleeping good.  Her energy level is good.  Last week she went to the urgent care for GI upset and she was given Bentyl which she took for 2 days and now she is feeling much better.  She denies any irritability, anger, mania or any psychosis.  She really like her current psychiatric medication.  She sleeping good with the trazodone.  She denies any major panic attack or any feeling of hopelessness or worthlessness.  She continues to work up to 25 hours a week and that seems to be working very good.  Her husband is very supportive.  Patient has no rash, itching, tremors or shakes.  She scheduled to have and will mammogram in coming weeks.  Patient denies drinking alcohol or using any illegal substances.  Her vital signs are stable.  She has no concerns from the medication.  Visit Diagnosis:    ICD-10-CM   1. GAD (generalized anxiety disorder) F41.1 traZODone (DESYREL) 100 MG tablet    lamoTRIgine (LAMICTAL) 100 MG tablet    Past Psychiatric History: Reviewed. Patient has history of depression and in the past she had tried Wellbutrin, Prozac, Zoloft, amitriptyline and Lexapro.  She denies any history of mania, psychosis, hallucination, suicidal attempt or any inpatient psychiatric treatment.  Past Medical History:  Past Medical History:  Diagnosis Date  . Abnormal Pap smear of cervix ~2006  . Anxiety   . Depression   . Fatigue     Past Surgical History:  Procedure Laterality Date  . COLPOSCOPY  ~ 2006   Normal - per pt    Family Psychiatric History: Reviewed.  Family History:  Family History  Problem Relation Age of Onset  . Adopted: Yes  . Family history unknown: Yes    Social History:  Social  History   Social History  . Marital status: Married    Spouse name: N/A  . Number of children: N/A  . Years of education: N/A   Social History Main Topics  . Smoking status: Former Games developermoker  . Smokeless tobacco: Never Used  . Alcohol use No  . Drug use: No  . Sexual activity: No   Other Topics Concern  . Not on file   Social History Narrative  . No narrative on file    Allergies:  Allergies  Allergen Reactions  . Penicillins   . Amitriptyline Anxiety    No longer taking    Metabolic Disorder Labs: No results found for: HGBA1C, MPG No results found for: PROLACTIN No results found for: CHOL, TRIG, HDL, CHOLHDL, VLDL, LDLCALC   Current Medications: Current Outpatient Prescriptions  Medication Sig Dispense Refill  . Calcium Carbonate-Vit D-Min (CALCIUM 1200 PO) Take by mouth.    . lamoTRIgine (LAMICTAL) 100 MG tablet Take 1 tablet (100 mg total) by mouth daily. 30 tablet 2  . Multiple Vitamin (MULTIVITAMIN) tablet Take 1 tablet by mouth daily.    . Omega-3 Fatty Acids (FISH OIL PO) Take by mouth daily.    . traZODone (DESYREL) 100 MG tablet TAKE 1 TABLET BY MOUTH AT BEDTIME 30 tablet 0   No current facility-administered medications for this visit.     Neurologic: Headache: No Seizure: No Paresthesias: No  Musculoskeletal: Strength &  Muscle Tone: within normal limits Gait & Station: normal Patient leans: N/A  Psychiatric Specialty Exam: ROS  Blood pressure 123/73, pulse 72, temperature 97.7 F (36.5 C), resp. rate 16, height 5' 7.52" (1.715 m), weight 148 lb (67.1 kg), last menstrual period 11/28/2012.Body mass index is 22.82 kg/m.  General Appearance: Casual  Eye Contact:  Good  Speech:  Clear and Coherent  Volume:  Normal  Mood:  Euthymic  Affect:  Congruent  Thought Process:  Goal Directed  Orientation:  Full (Time, Place, and Person)  Thought Content: Logical   Suicidal Thoughts:  No  Homicidal Thoughts:  No  Memory:  Immediate;   Good Recent;    Good Remote;   Good  Judgement:  Good  Insight:  Good  Psychomotor Activity:  Normal  Concentration:  Concentration: Good and Attention Span: Good  Recall:  Good  Fund of Knowledge: Good  Language: Good  Akathisia:  No  Handed:  Right  AIMS (if indicated):  0  Assets:  Communication Skills Desire for Improvement Physical Health Resilience Social Support Talents/Skills Transportation  ADL's:  Intact  Cognition: WNL  Sleep:  Good     Assessment: Generalized anxiety disorder.  Major depressive disorder, recurrent.  Plan: Patient is doing better on her current psychiatric medication.  Continue Lamictal 100 mg daily and trazodone 100 mg at bedtime.  She has no rash, itching, tremors or any shakes.  Recommended to call us back if she has any question, concern or if she feels worsening of the symptom.  Follow-up in 3 months.  Patient will see her primary care physician in few weeks for annual checkup.  ARFEEN,SYED T., MD 04/07/2017, 8:40 AM

## 2017-04-18 ENCOUNTER — Other Ambulatory Visit: Payer: Self-pay | Admitting: Certified Nurse Midwife

## 2017-04-18 DIAGNOSIS — Z1231 Encounter for screening mammogram for malignant neoplasm of breast: Secondary | ICD-10-CM

## 2017-04-22 ENCOUNTER — Ambulatory Visit
Admission: RE | Admit: 2017-04-22 | Discharge: 2017-04-22 | Disposition: A | Discharge status: Home / Self Care | Payer: 59 | Source: Ambulatory Visit | Attending: Certified Nurse Midwife | Admitting: Certified Nurse Midwife

## 2017-04-22 DIAGNOSIS — Z1231 Encounter for screening mammogram for malignant neoplasm of breast: Secondary | ICD-10-CM

## 2017-07-02 ENCOUNTER — Other Ambulatory Visit (HOSPITAL_COMMUNITY): Payer: Self-pay | Admitting: Psychiatry

## 2017-07-02 DIAGNOSIS — F411 Generalized anxiety disorder: Secondary | ICD-10-CM

## 2017-07-07 ENCOUNTER — Telehealth (HOSPITAL_COMMUNITY): Payer: Self-pay

## 2017-07-07 NOTE — Telephone Encounter (Signed)
I returned patient's phone call.  She is complaining of tingling, numbness in her hand and feet.  She admitted it is making her more nervous and anxious.  I recommended she should see neurology because there has been no changes in the medication.  She is taking Lamictal 100 mg for a while.  She agreed to see neurologist.  We will refer her to go for neurology rule out neuropathy and nerve conduction test.  She preferred to be called on her cell phone.

## 2017-07-07 NOTE — Telephone Encounter (Signed)
Patient is calling, she states that she is having tingling and numbness in her hands and feet. She said she is getting easily overwhelmed as well. Please review and advise, thank you

## 2017-07-08 ENCOUNTER — Other Ambulatory Visit (HOSPITAL_COMMUNITY): Payer: Self-pay | Admitting: Psychiatry

## 2017-07-08 DIAGNOSIS — F411 Generalized anxiety disorder: Secondary | ICD-10-CM

## 2017-07-11 ENCOUNTER — Other Ambulatory Visit (HOSPITAL_COMMUNITY): Payer: Self-pay | Admitting: Psychiatry

## 2017-07-11 ENCOUNTER — Ambulatory Visit (HOSPITAL_COMMUNITY): Payer: 59 | Admitting: Psychiatry

## 2017-07-11 ENCOUNTER — Encounter (HOSPITAL_COMMUNITY): Payer: Self-pay | Admitting: Psychiatry

## 2017-07-11 DIAGNOSIS — F411 Generalized anxiety disorder: Secondary | ICD-10-CM

## 2017-07-11 DIAGNOSIS — Z87891 Personal history of nicotine dependence: Secondary | ICD-10-CM | POA: Diagnosis not present

## 2017-07-11 DIAGNOSIS — F332 Major depressive disorder, recurrent severe without psychotic features: Secondary | ICD-10-CM | POA: Diagnosis not present

## 2017-07-11 MED ORDER — LAMOTRIGINE 150 MG PO TABS: 150.0000 mg | ORAL_TABLET | Freq: Every day | ORAL | 0 refills | Status: DC

## 2017-07-11 MED ORDER — TRAZODONE HCL 100 MG PO TABS: 100.0000 mg | ORAL_TABLET | Freq: Every day | ORAL | 0 refills | Status: DC

## 2017-07-11 NOTE — Progress Notes (Signed)
BH MD/PA/NP OP Progress Note  07/11/2017 8:00 AM Jaime [name]  MRN:  [id number]  Chief Complaint: I still sometimes get emotional and tearful.  I have noticed numbness in my hand and feet.  HPI: Patient came for her follow-up appointment.  She is taking Lamictal and trazodone.  She feels medicine working very well but she continues to have days when she gets emotional, tearful and anxious.  She did not mention any stressors but because her job is going very well.  Her husband is working and there has been no new issues.  She saw her primary care physician Dr. Cala BradfordKimberly [name] 2 months ago and everything was okay.  She is sleeping good with the trazodone.  She noticed neuropathy in her hand and feet 3 weeks ago.  There has been no changes in the medication.  She denies any irritability, anger, mania, psychosis.  She denies any hallucination or any paranoia.  She has no rash, itching or tremors.  Her energy level is good.  Her appetite is okay.  Her vital signs are stable.  Patient denies any aggressive behavior.  Visit Diagnosis:    ICD-10-CM   1. GAD (generalized anxiety disorder) F41.1 traZODone (DESYREL) 100 MG tablet    lamoTRIgine (LAMICTAL) 150 MG tablet    Past Psychiatric History: Reviewed. Patient has history of depression and in the past she had tried Wellbutrin, Prozac, Zoloft, amitriptyline and Lexapro. She denies any history of mania, psychosis, hallucination, suicidal attempt or any inpatient psychiatric treatment.  Past Medical History:  Past Medical History:  Diagnosis Date  . Abnormal Pap smear of cervix ~2006  . Anxiety   . Depression   . Fatigue     Past Surgical History:  Procedure Laterality Date  . COLPOSCOPY  ~ 2006   Normal - per pt    Family Psychiatric History: Reviewed.  Family History:  Family History  Adopted: Yes  Family history unknown: Yes    Social History:  Social History   Socioeconomic History  . Marital status: Married    Spouse  name: Not on file  . Number of children: Not on file  . Years of education: Not on file  . Highest education level: Not on file  Social Needs  . Financial resource strain: Not on file  . Food insecurity - worry: Not on file  . Food insecurity - inability: Not on file  . Transportation needs - medical: Not on file  . Transportation needs - non-medical: Not on file  Occupational History  . Not on file  Tobacco Use  . Smoking status: Former Games developermoker  . Smokeless tobacco: Never Used  Substance and Sexual Activity  . Alcohol use: No    Alcohol/week: 0.0 oz  . Drug use: No  . Sexual activity: No    Partners: Male    Birth control/protection: Post-menopausal  Other Topics Concern  . Not on file  Social History Narrative  . Not on file    Allergies:  Allergies  Allergen Reactions  . Penicillins   . Amitriptyline Anxiety    No longer taking    Metabolic Disorder Labs: No results found for: HGBA1C, MPG No results found for: PROLACTIN No results found for: CHOL, TRIG, HDL, CHOLHDL, VLDL, LDLCALC No results found for: TSH  Therapeutic Level Labs: No results found for: LITHIUM No results found for: VALPROATE No components found for:  CBMZ  Current Medications: Current Outpatient Medications  Medication Sig Dispense Refill  . Calcium Carbonate-Vit D-Min (CALCIUM  1200 PO) Take by mouth.    . lamoTRIgine (LAMICTAL) 100 MG tablet Take 1 tablet (100 mg total) by mouth daily. 90 tablet 0  . Multiple Vitamin (MULTIVITAMIN) tablet Take 1 tablet by mouth daily.    . Omega-3 Fatty Acids (FISH OIL PO) Take by mouth daily.    . traZODone (DESYREL) 100 MG tablet Take 1 tablet (100 mg total) by mouth at bedtime. 90 tablet 0   No current facility-administered medications for this visit.      Musculoskeletal: Strength & Muscle Tone: within normal limits Gait & Station: normal Patient leans: N/A  Psychiatric Specialty Exam: ROS  Blood pressure 122/74, pulse 67, height 5\' 7"  (1.702  m), weight 147 lb (66.7 kg), last menstrual period 11/28/2012.There is no height or weight on file to calculate BMI.  General Appearance: Casual  Eye Contact:  Good  Speech:  Clear and Coherent  Volume:  Normal  Mood:  Anxious  Affect:  Congruent  Thought Process:  Goal Directed  Orientation:  Full (Time, Place, and Person)  Thought Content: Rumination   Suicidal Thoughts:  No  Homicidal Thoughts:  No  Memory:  Immediate;   Good Recent;   Good Remote;   Good  Judgement:  Good  Insight:  Good  Psychomotor Activity:  Normal  Concentration:  Concentration: Good and Attention Span: Good  Recall:  Good  Fund of Knowledge: Good  Language: Good  Akathisia:  No  Handed:  Right  AIMS (if indicated): not done  Assets:  Communication Skills Desire for Improvement Housing Resilience  ADL's:  Intact  Cognition: WNL  Sleep:  Good   Screenings: PHQ2-9     Counselor from 08/27/2014 in BEHAVIORAL HEALTH OUTPATIENT THERAPY [place]  PHQ-2 Total Score  4  PHQ-9 Total Score  14       Assessment and Plan: Generalized anxiety disorder.  Depressive disorder, recurrent.  Reassurance given.  It is unclear why she have neuropathy in her hands and feet.  I recommended to see neurologist and we will try to get help to [facility].  I recommended to try Lamictal 150 mg to help the residual mood lability and depression.  Continue trazodone 100 mg at bedtime.  Patient is not interested in counseling.  Recommended to call us back if she has any question, concern or if she feels worsening of the symptoms.  We will try to get records from her primary care physician Dr. Cam HaiKimberly [name] as patient recently had physical and annual checkup.  Follow-up in 3 months.  t   Jaime [name] T., MD 07/11/2017, 8:00 AM

## 2017-07-18 ENCOUNTER — Encounter: Payer: Self-pay | Admitting: Neurology

## 2017-07-18 ENCOUNTER — Other Ambulatory Visit: Payer: Self-pay

## 2017-07-18 ENCOUNTER — Ambulatory Visit: Payer: 59 | Admitting: Neurology

## 2017-07-18 VITALS — BP 121/70 | HR 68 | Ht 67.5 in | Wt 147.5 lb

## 2017-07-18 DIAGNOSIS — R202 Paresthesia of skin: Secondary | ICD-10-CM | POA: Diagnosis not present

## 2017-07-18 NOTE — Progress Notes (Signed)
Reason for visit: Paresthesias  Referring physician: Dr. Gaetano NetArfeen  Jaime [name] is a 51 y.o. female  History of present illness:  Jaime [name] is a 51 year old right-handed white female with a history of anxiety.  The patient also claims to have a history of fibromyalgia.  She had an episode of paresthesias on all 4 extremities 4-6 months ago, she attributed this to an antidepressant medication that she was on at the time.  This was removed and her symptoms went away.  The patient has been on Lamictal, the dose was increased about a week or 10 days ago, but she began having onset of numbness and paresthesias on all 4 extremities that came on at the same time 3 weeks ago.  The patient believes that there has been some gradual worsening of the numbness since onset, the numbness never goes away completely.  The numbness is unassociated with any overt pain, weakness of the extremities, or balance changes.  She denies any neck pain or low back pain.  She has not had any difficulty controlling the bowels of the bladder.  There has been no numbness on the body or the face, she denies any visual changes or difficulty with speech or swallowing.  The numbness and tingling is more noticeable when she is sitting and not active.  She is sent to this office for the evaluation.  Past Medical History:  Diagnosis Date  . Abnormal Pap smear of cervix ~2006  . Anxiety   . Depression   . Fatigue     Past Surgical History:  Procedure Laterality Date  . COLPOSCOPY  ~ 2006   Normal - per pt    Family History  Adopted: Yes  Family history unknown: Yes    Social history:  reports that  has never smoked. she has never used smokeless tobacco. She reports that she does not drink alcohol or use drugs.  Medications:  Prior to Admission medications   Medication Sig Start Date End Date Taking? Authorizing Provider  Calcium Carbonate-Vit D-Min (CALCIUM 1200 PO) Take by mouth.   Yes [provider]    lamoTRIgine (LAMICTAL) 150 MG tablet Take 1 tablet (150 mg total) daily by mouth. 07/11/17  Yes Arfeen, Phillips GroutSyed T, MD  Multiple Vitamin (MULTIVITAMIN) tablet Take 1 tablet by mouth daily.   Yes [provider]  Omega-3 Fatty Acids (FISH OIL PO) Take by mouth daily.   Yes [provider]  traZODone (DESYREL) 100 MG tablet Take 1 tablet (100 mg total) at bedtime by mouth. 07/11/17  Yes Arfeen, Phillips GroutSyed T, MD      Allergies  Allergen Reactions  . Penicillins     High school  . Amitriptyline Anxiety    No longer taking    ROS:  Out of a complete 14 system review of symptoms, the patient complains only of the following symptoms, and all other reviewed systems are negative.  Numbness Depression, anxiety  Blood pressure 121/70, pulse 68, height 5' 7.5" (1.715 m), weight 147 lb 8 oz (66.9 kg), last menstrual period 11/28/2012, SpO2 98 %.  Physical Exam  General: The patient is alert and cooperative at the time of the examination.  Eyes: Pupils are equal, round, and reactive to light. Discs are flat bilaterally.  Neck: The neck is supple, no carotid bruits are noted.  Respiratory: The respiratory examination is clear.  Cardiovascular: The cardiovascular examination reveals a regular rate and rhythm, no obvious murmurs or rubs are noted.  Skin: Extremities are  without significant edema.  Neurologic Exam  Mental status: The patient is alert and oriented x 3 at the time of the examination. The patient has apparent normal recent and remote memory, with an apparently normal attention span and concentration ability.  Cranial nerves: Facial symmetry is present. There is good sensation of the face to pinprick and soft touch bilaterally. The strength of the facial muscles and the muscles to head turning and shoulder shrug are normal bilaterally. Speech is well enunciated, no aphasia or dysarthria is noted. Extraocular movements are full. Visual fields are full. The tongue is  midline, and the patient has symmetric elevation of the soft palate. No obvious hearing deficits are noted.  Motor: The motor testing reveals 5 over 5 strength of all 4 extremities. Good symmetric motor tone is noted throughout.  Sensory: Sensory testing is intact to pinprick, soft touch, vibration sensation, and position sense on all 4 extremities. No evidence of extinction is noted.  Coordination: Cerebellar testing reveals good finger-nose-finger and heel-to-shin bilaterally.  Gait and station: Gait is normal. Tandem gait is normal. Romberg is negative. No drift is seen.  Reflexes: Deep tendon reflexes are symmetric and normal bilaterally. Toes are downgoing bilaterally.   Assessment/Plan:  1.  Numbness, all 4 extremities  2.  Anxiety disorder  3.  Fibromyalgia  The etiology of the numbness is not clear.  The patient has had symptoms on all 4 extremities at the same time, this is likely not related to a neuropathy problem.  The patient will be sent for blood work today, she will have MRI of the cervical spine to exclude cord compression or demyelinating disease.  MRI of the brain may need to be done in the future.  The patient will follow-up in 4 months.  The clinical examination today was normal.  Marlan Palau. Keith Willis MD 07/18/2017 1:29 PM  [facility] [address] [place], [place] [id number]  Phone [phone] Fax [phone]

## 2017-07-18 NOTE — Patient Instructions (Signed)
   We will get MRI of the cervical spine. 

## 2017-07-20 ENCOUNTER — Telehealth: Payer: Self-pay | Admitting: *Deleted

## 2017-07-20 LAB — COMPREHENSIVE METABOLIC PANEL
A/G RATIO: 1.8 (ref 1.2–2.2)
ALBUMIN: 4.6 g/dL (ref 3.5–5.5)
ALK PHOS: 59 IU/L (ref 39–117)
ALT: 13 IU/L (ref 0–32)
AST: 20 IU/L (ref 0–40)
BUN / CREAT RATIO: 18 (ref 9–23)
BUN: 18 mg/dL (ref 6–24)
CO2: 26 mmol/L (ref 20–29)
CREATININE: 1 mg/dL (ref 0.57–1.00)
Calcium: 9.7 mg/dL (ref 8.7–10.2)
Chloride: 101 mmol/L (ref 96–106)
GFR calc Af Amer: 75 mL/min/{1.73_m2} (ref >59)
GFR calc non Af Amer: 65 mL/min/{1.73_m2} (ref >59)
GLOBULIN, TOTAL: 2.5 g/dL (ref 1.5–4.5)
Glucose: 93 mg/dL (ref 65–99)
POTASSIUM: 4.5 mmol/L (ref 3.5–5.2)
SODIUM: 142 mmol/L (ref 134–144)
Total Protein: 7.1 g/dL (ref 6.0–8.5)

## 2017-07-20 LAB — B. BURGDORFI ANTIBODIES: Lyme IgG/IgM Ab: <0.91 {ISR} (ref 0.00–0.90)

## 2017-07-20 LAB — HIV ANTIBODY (ROUTINE TESTING W REFLEX): HIV SCREEN 4TH GENERATION: NONREACTIVE

## 2017-07-20 LAB — RPR: RPR Ser Ql: NONREACTIVE

## 2017-07-20 LAB — COPPER, SERUM: COPPER: 105 ug/dL (ref 72–166)

## 2017-07-20 LAB — ANA W/REFLEX: ANA: NEGATIVE

## 2017-07-20 LAB — VITAMIN B12: VITAMIN B 12: 523 pg/mL (ref 232–1245)

## 2017-07-20 LAB — ANGIOTENSIN CONVERTING ENZYME: ANGIO CONVERT ENZYME: 35 U/L (ref 14–82)

## 2017-07-20 LAB — RHEUMATOID FACTOR

## 2017-07-20 LAB — SEDIMENTATION RATE: SED RATE: 2 mm/h (ref 0–40)

## 2017-07-20 NOTE — Telephone Encounter (Signed)
-----   Message from York Spanielharles K Willis, MD sent at 07/20/2017  7:11 AM EST -----  The blood work results are unremarkable. Please call the patient.  ----- Message ----- From: Nell RangeInterface, [facility] Lab Results In Sent: 07/19/2017   7:42 AM To: York Spanielharles K Willis, MD

## 2017-07-20 NOTE — Telephone Encounter (Signed)
Called and spoke with pt about unremarkable labs per CW,MD note. She verbalized understanding.  

## 2017-07-27 ENCOUNTER — Ambulatory Visit: Payer: 59

## 2017-07-27 DIAGNOSIS — R202 Paresthesia of skin: Secondary | ICD-10-CM

## 2017-07-27 MED ORDER — GADOPENTETATE DIMEGLUMINE 469.01 MG/ML IV SOLN: 13.0000 mL | Freq: Once | INTRAVENOUS | Status: DC | PRN

## 2017-07-28 ENCOUNTER — Telehealth: Payer: Self-pay | Admitting: Neurology

## 2017-07-28 DIAGNOSIS — R202 Paresthesia of skin: Secondary | ICD-10-CM

## 2017-07-28 NOTE — Telephone Encounter (Signed)
I called the patient.  MRI of the neck is completely normal.  We will check nerve conduction studies on both legs in one arm, EMG on one leg.  If this is normal, we will consider MRI of the brain.   MRI cervical 07/28/17:  IMPRESSION:   Unremarkable MRI cervical spine (with and without).  No spinal stenosis or foraminal narrowing.

## 2017-08-03 ENCOUNTER — Ambulatory Visit: Payer: 59 | Admitting: Neurology

## 2017-08-03 ENCOUNTER — Ambulatory Visit (INDEPENDENT_AMBULATORY_CARE_PROVIDER_SITE_OTHER): Payer: 59 | Admitting: Neurology

## 2017-08-03 ENCOUNTER — Encounter: Payer: Self-pay | Admitting: Neurology

## 2017-08-03 DIAGNOSIS — R202 Paresthesia of skin: Secondary | ICD-10-CM

## 2017-08-03 MED ORDER — GABAPENTIN 100 MG PO CAPS: 100.0000 mg | ORAL_CAPSULE | Freq: Three times a day (TID) | ORAL | 3 refills | Status: DC

## 2017-08-03 NOTE — Progress Notes (Signed)
Please refer to EMG and nerve conduction study procedure note. 

## 2017-08-03 NOTE — Progress Notes (Signed)
Patient came in today for EMG and nerve conduction study evaluation.  There is evidence of some left peroneal nerve dysfunction on nerve conduction study, no evidence of a peripheral neuropathy.  EMG of the left lower extremity was unremarkable without evidence of a radiculopathy.  The patient will be set up for MRI of the brain to exclude demyelinating disease.  She indicates that the numbness is associated with paresthesias that are uncomfortable, we will start low-dose gabapentin.

## 2017-08-03 NOTE — Procedures (Signed)
     HISTORY:  Jaime [name] is a [age] year old patient with a history of paresthesias involving all 4 extremities.  The patient is being evaluated for a possible neuropathy as a source of her symptoms.  NERVE CONDUCTION STUDIES:  Nerve conduction studies were performed on the left upper extremity. The distal motor latencies and motor amplitudes for the median and ulnar nerves were within normal limits. The F wave latencies and nerve conduction velocities for these nerves were also normal. The sensory latencies for the median and ulnar nerves were normal.  Nerve conduction studies were performed on both lower extremities.  The distal motor latencies for the peroneal nerves were prolonged on the left, normal on the right, with low motor amplitudes for these nerves bilaterally.  The distal motor latencies and motor amplitudes for the posterior tibial nerves were normal bilaterally.  The sensory latencies for the peroneal nerves were normal on the right and absent on the left.  The H reflex latencies were within normal limits bilaterally.  EMG STUDIES:  EMG study was performed on the left lower extremity:  The tibialis anterior muscle reveals 2 to 4K motor units with full recruitment. No fibrillations or positive waves were seen. The peroneus tertius muscle reveals 2 to 4K motor units with full recruitment. No fibrillations or positive waves were seen. The medial gastrocnemius muscle reveals 1 to 3K motor units with full recruitment. No fibrillations or positive waves were seen. The vastus lateralis muscle reveals 2 to 4K motor units with full recruitment. No fibrillations or positive waves were seen. The iliopsoas muscle reveals 2 to 4K motor units with full recruitment. No fibrillations or positive waves were seen. The biceps femoris muscle (long head) reveals 2 to 4K motor units with full recruitment. No fibrillations or positive waves were seen. The lumbosacral paraspinal muscles were tested  at 3 levels, and revealed no abnormalities of insertional activity at all 3 levels tested. There was good relaxation.   IMPRESSION:  Nerve conduction studies done on the left upper extremity and both lower extremities shows evidence of mild dysfunction of the left peroneal nerve, otherwise there is no evidence of a generalized peripheral neuropathy.  EMG evaluation of the left lower extremity was unremarkable without evidence of an overlying lumbosacral radiculopathy.  Marlan Palau. Keith Sharrod Achille MD 08/03/2017 4:45 PM  [facility] [address] [place], [place] [id number]  Phone [phone] Fax [phone]

## 2017-08-18 NOTE — Telephone Encounter (Signed)
I called the patient.  MRI of the cervical spine and EMG nerve conduction studies have not explain the paresthesias on all 4 extremities.  The patient will be set up for MRI of the brain.

## 2017-08-18 NOTE — Addendum Note (Signed)
Addended by: York SpanielWILLIS, Jordanna Hendrie K on: 08/18/2017 02:54 PM   Modules accepted: Orders

## 2017-08-18 NOTE — Telephone Encounter (Signed)
Pt states she was told by Dr Anne HahnWillis that she would possibly need to have a brain scan done.  Pt is asking for a call back re: if and when that should be done.  Please call

## 2017-08-28 ENCOUNTER — Ambulatory Visit
Admission: RE | Admit: 2017-08-28 | Discharge: 2017-08-28 | Disposition: A | Discharge status: Home / Self Care | Payer: 59 | Source: Ambulatory Visit | Attending: Neurology | Admitting: Neurology

## 2017-08-28 DIAGNOSIS — R202 Paresthesia of skin: Secondary | ICD-10-CM

## 2017-08-31 ENCOUNTER — Telehealth: Payer: Self-pay | Admitting: Neurology

## 2017-08-31 NOTE — Telephone Encounter (Signed)
I called the patient.  MRI of the brain is completely normal, this combined with normal blood work, normal nerve conduction and EMG evaluation, MRI of the cervical spine suggests a benign etiology of her sensory complaints.  The patient does have a history of anxiety and fibromyalgia.   MRI brain 08/28/17:   IMPRESSION:  This is a normal age-appropriate MRI of the brain without contrast.

## 2017-10-11 ENCOUNTER — Ambulatory Visit (HOSPITAL_COMMUNITY): Payer: 59 | Admitting: Psychiatry

## 2017-10-11 ENCOUNTER — Encounter (HOSPITAL_COMMUNITY): Payer: Self-pay | Admitting: Psychiatry

## 2017-10-11 DIAGNOSIS — F411 Generalized anxiety disorder: Secondary | ICD-10-CM

## 2017-10-11 DIAGNOSIS — F339 Major depressive disorder, recurrent, unspecified: Secondary | ICD-10-CM

## 2017-10-11 DIAGNOSIS — Z79899 Other long term (current) drug therapy: Secondary | ICD-10-CM

## 2017-10-11 DIAGNOSIS — G629 Polyneuropathy, unspecified: Secondary | ICD-10-CM | POA: Diagnosis not present

## 2017-10-11 MED ORDER — LAMOTRIGINE 150 MG PO TABS
150.0000 mg | ORAL_TABLET | Freq: Every day | ORAL | 0 refills | Status: DC
Start: 2017-10-11 — End: 2018-01-09

## 2017-10-11 MED ORDER — TRAZODONE HCL 100 MG PO TABS: 100.0000 mg | ORAL_TABLET | Freq: Every day | ORAL | 0 refills | Status: DC

## 2017-10-11 NOTE — Progress Notes (Signed)
BH MD/PA/NP OP Progress Note  10/11/2017 8:11 AM Jaime [name]  MRN:  [id number]  Chief Complaint: I am doing better.  I saw a neurologist and I am taking gabapentin 100 mg twice a day.  HPI: Jaime [name] came for her follow-up appointment.  She is taking Lamictal trazodone and recently neurologist started her on Neurontin 100 mg up to 3 times a day but she is only taking twice a day.  On her last visit we recommended to see neurologist because she was complaining of neuropathy.  Patient has extensive workup including MRI, EMG and blood levels.  Patient was told everything is normal and she is very pleased.  She was given gabapentin which she is taking 100 mg twice a day and she noticed her tingling and neuropathy is much better.  She is also tolerating increased dose of Lamictal.  She denies any crying spells, irritability or any feeling of hopelessness or worthlessness.  Patient is more calm pleasant.  She continues to work however she cut down her work and only working 4-6 hours a day.  Her appetite is good.  Energy level is good.  Patient denies any hallucination, paranoia, mania or psychosis.  She has no rash or itching.  She had a good Christmas and holidays.  She has no tremors.  Visit Diagnosis:    ICD-10-CM   1. GAD (generalized anxiety disorder) F41.1 traZODone (DESYREL) 100 MG tablet    lamoTRIgine (LAMICTAL) 150 MG tablet    Past Psychiatric History: Viewed Patient has history of depression and in the past she had tried Wellbutrin, Prozac, Zoloft, amitriptyline and Lexapro. She denies any history of mania, psychosis, hallucination, suicidal attempt or any inpatient psychiatric treatment.  Past Medical History:  Past Medical History:  Diagnosis Date  . Abnormal Pap smear of cervix ~2006  . Anxiety   . Depression   . Fatigue     Past Surgical History:  Procedure Laterality Date  . COLPOSCOPY  ~ 2006   Normal - per pt    Family Psychiatric History: Viewed.  Family History:   Family History  Adopted: Yes  Family history unknown: Yes    Social History:  Social History   Socioeconomic History  . Marital status: Married    Spouse name: Molly MaduroRobert  . Number of children: 0  . Years of education: 3512  . Highest education level: Not on file  Social Needs  . Financial resource strain: Not on file  . Food insecurity - worry: Not on file  . Food insecurity - inability: Not on file  . Transportation needs - medical: Not on file  . Transportation needs - non-medical: Not on file  Occupational History  . Occupation: Self employed  Tobacco Use  . Smoking status: Never Smoker  . Smokeless tobacco: Never Used  Substance and Sexual Activity  . Alcohol use: No    Alcohol/week: 0.0 oz  . Drug use: No  . Sexual activity: No    Partners: Male    Birth control/protection: Post-menopausal  Other Topics Concern  . Not on file  Social History Narrative   Right handed    Caffeine use: 1 Mt Dew every morning   Lives with husband    Allergies:  Allergies  Allergen Reactions  . Penicillins     High school  . Amitriptyline Anxiety    No longer taking    Metabolic Disorder Labs: No results found for: HGBA1C, MPG No results found for: PROLACTIN No results found for: CHOL, TRIG,  HDL, CHOLHDL, VLDL, LDLCALC No results found for: TSH  Therapeutic Level Labs: No results found for: LITHIUM No results found for: VALPROATE No components found for:  CBMZ  Current Medications: Current Outpatient Medications  Medication Sig Dispense Refill  . Calcium Carbonate-Vit D-Min (CALCIUM 1200 PO) Take by mouth.    . gabapentin (NEURONTIN) 100 MG capsule Take 1 capsule (100 mg total) by mouth 3 (three) times daily. 90 capsule 3  . lamoTRIgine (LAMICTAL) 150 MG tablet Take 1 tablet (150 mg total) daily by mouth. 90 tablet 0  . Multiple Vitamin (MULTIVITAMIN) tablet Take 1 tablet by mouth daily.    . Omega-3 Fatty Acids (FISH OIL PO) Take by mouth daily.    . traZODone  (DESYREL) 100 MG tablet Take 1 tablet (100 mg total) at bedtime by mouth. 90 tablet 0   No current facility-administered medications for this visit.      Musculoskeletal: Strength & Muscle Tone: within normal limits Gait & Station: normal Patient leans: N/A  Psychiatric Specialty Exam: ROS  Blood pressure 122/80, pulse 77, height 5' 7.5" (1.715 m), weight 150 lb (68 kg), last menstrual period 11/28/2012.Body mass index is 23.15 kg/m.  General Appearance: Casual  Eye Contact:  Good  Speech:  Clear and Coherent  Volume:  Normal  Mood:  Euthymic  Affect:  Appropriate  Thought Process:  Goal Directed  Orientation:  Full (Time, Place, and Person)  Thought Content: Logical   Suicidal Thoughts:  No  Homicidal Thoughts:  No  Memory:  Immediate;   Good Recent;   Good Remote;   Good  Judgement:  Good  Insight:  Good  Psychomotor Activity:  Normal  Concentration:  Concentration: Good and Attention Span: Good  Recall:  Good  Fund of Knowledge: Good  Language: Good  Akathisia:  No  Handed:  Right  AIMS (if indicated): not done  Assets:  Communication Skills Desire for Improvement Housing Resilience Social Support Talents/Skills Transportation  ADL's:  Intact  Cognition: WNL  Sleep:  Good   Screenings: PHQ2-9     Counselor from 08/27/2014 in BEHAVIORAL HEALTH OUTPATIENT THERAPY [place]  PHQ-2 Total Score  4  PHQ-9 Total Score  14       Assessment and Plan: Generalized anxiety disorder.  Major depressive disorder, recurrent.    I reviewed records from neurology including blood work results and collateral information.  Patient doing much better since we increased the Lamictal.  She also taking Neurontin 100 mg twice a day from neurology.  She has no rash or itching.  Her sleep is good with the trazodone.  Continue trazodone 100 mg at bedtime and Lamictal 150 mg daily.  Recommended to call us back if she has any question, concern or if she feels worsening of the  symptoms.  Follow-up in 3 months.   Cleotis Nipper, MD 10/11/2017, 8:11 AM

## 2017-12-12 ENCOUNTER — Encounter: Payer: Self-pay | Admitting: Neurology

## 2017-12-12 ENCOUNTER — Ambulatory Visit: Payer: 59 | Admitting: Neurology

## 2017-12-12 VITALS — BP 117/76 | HR 63 | Ht 67.5 in | Wt 151.0 lb

## 2017-12-12 DIAGNOSIS — R202 Paresthesia of skin: Secondary | ICD-10-CM | POA: Diagnosis not present

## 2017-12-12 NOTE — Progress Notes (Signed)
Reason for visit: Paresthesias  Jaime [name] is an 52 y.o. female  History of present illness:  Jaime [name] is a 52 year old right-handed white female with a history of anxiety and depression who reports some problems with paresthesias of all 4 extremities.  The patient has undergone extensive workup that included blood work, MRI of the brain and cervical spine, and EMG and nerve conduction study evaluation.  The studies did not show an underlying etiology for her sensory complaints.  The patient indicates that she is getting benefit with Lamictal, if she does heavy strenuous activity she may have onset of some paresthesias, but if she takes an extra Lamictal this will go away.  The patient reports no weakness or balance changes.  She is sleeping well at night.  She returns for an evaluation.  Past Medical History:  Diagnosis Date  . Abnormal Pap smear of cervix ~2006  . Anxiety   . Depression   . Fatigue     Past Surgical History:  Procedure Laterality Date  . COLPOSCOPY  ~ 2006   Normal - per pt    Family History  Adopted: Yes  Family history unknown: Yes    Social history:  reports that she has never smoked. She has never used smokeless tobacco. She reports that she does not drink alcohol or use drugs.    Allergies  Allergen Reactions  . Penicillins     High school  . Amitriptyline Anxiety    No longer taking    Medications:  Prior to Admission medications   Medication Sig Start Date End Date Taking? Authorizing Provider  Calcium Carbonate-Vit D-Min (CALCIUM 1200 PO) Take by mouth.    [provider]  gabapentin (NEURONTIN) 100 MG capsule Take 1 capsule (100 mg total) by mouth 3 (three) times daily. 08/03/17   York Spaniel, MD  lamoTRIgine (LAMICTAL) 150 MG tablet Take 1 tablet (150 mg total) by mouth daily. 10/11/17   Arfeen, Phillips Grout, MD  Multiple Vitamin (MULTIVITAMIN) tablet Take 1 tablet by mouth daily.    [provider]  Omega-3 Fatty  Acids (FISH OIL PO) Take by mouth daily.    [provider]  traZODone (DESYREL) 100 MG tablet Take 1 tablet (100 mg total) by mouth at bedtime. 10/11/17   Arfeen, Phillips Grout, MD    ROS:  Out of a complete 14 system review of symptoms, the patient complains only of the following symptoms, and all other reviewed systems are negative.  Numbness, paresthesias  Last menstrual period 11/28/2012.  Physical Exam  General: The patient is alert and cooperative at the time of the examination.  Skin: No significant peripheral edema is noted.   Neurologic Exam  Mental status: The patient is alert and oriented x 3 at the time of the examination. The patient has apparent normal recent and remote memory, with an apparently normal attention span and concentration ability.   Cranial nerves: Facial symmetry is present. Speech is normal, no aphasia or dysarthria is noted. Extraocular movements are full. Visual fields are full.  Motor: The patient has good strength in all 4 extremities.  Sensory examination: Soft touch sensation is symmetric on the face, arms, and legs.  Coordination: The patient has good finger-nose-finger and heel-to-shin bilaterally.  Gait and station: The patient has a normal gait. Tandem gait is normal. Romberg is negative. No drift is seen.  Reflexes: Deep tendon reflexes are symmetric.    MRI cervical 07/28/17:  IMPRESSION:   Unremarkable MRI  cervical spine (with and without). No spinal stenosis or foraminal narrowing.   * MRI scan images were reviewed online. I agree with the written report.   MRI brain 08/28/17:   IMPRESSION: This is a normal age-appropriate MRI of the brain without contrast.  * MRI scan images were reviewed online. I agree with the written report.   EMG and NCV study 08/03/17:  IMPRESSION:  Nerve conduction studies done on the left upper extremity and both lower extremities shows evidence of mild dysfunction of the left  peroneal nerve, otherwise there is no evidence of a generalized peripheral neuropathy.  EMG evaluation of the left lower extremity was unremarkable without evidence of an overlying lumbosacral radiculopathy.    Assessment/Plan:  1.  Intermittent paresthesias  The patient has had an extensive workup that did not show an etiology for her sensory complaints.  This indicates that the etiology is likely to be benign, and not likely to result in any disability in the future.  The patient will follow-up through this office if needed, she will contact me if any new issues arise.  Marlan Palau. Keith Willis MD 12/12/2017 7:34 AM  [facility] [address] [place], [place] [id number]  Phone [phone] Fax [phone]

## 2018-01-09 ENCOUNTER — Ambulatory Visit (HOSPITAL_COMMUNITY): Payer: 59 | Admitting: Psychiatry

## 2018-01-09 ENCOUNTER — Encounter (HOSPITAL_COMMUNITY): Payer: Self-pay | Admitting: Psychiatry

## 2018-01-09 DIAGNOSIS — F411 Generalized anxiety disorder: Secondary | ICD-10-CM

## 2018-01-09 DIAGNOSIS — F332 Major depressive disorder, recurrent severe without psychotic features: Secondary | ICD-10-CM

## 2018-01-09 MED ORDER — LAMOTRIGINE 150 MG PO TABS: 150.0000 mg | ORAL_TABLET | Freq: Every day | ORAL | 0 refills | Status: DC

## 2018-01-09 MED ORDER — GABAPENTIN 100 MG PO CAPS: 100.0000 mg | ORAL_CAPSULE | Freq: Three times a day (TID) | ORAL | 3 refills | Status: DC

## 2018-01-09 MED ORDER — TRAZODONE HCL 100 MG PO TABS: 100.0000 mg | ORAL_TABLET | Freq: Every day | ORAL | 2 refills | Status: DC

## 2018-01-09 NOTE — Progress Notes (Signed)
BH MD/PA/NP OP Progress Note  01/09/2018 8:12 AM LINNEA TODISCO  MRN:  [id number]  Chief Complaint: I am doing better on my medication.  Sometime I have difficulty concentration and forgetfulness.  HPI: Jaime [name] came for her follow-up appointment.  She is taking Lamictal trazodone and Neurontin.  Overall she describes her mood is good but sometimes she noticed forgetful and have poor attention and concentration.  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  Sometimes she forgets if she had clean the room and only realized when she go back and see room is clean.  She is sleeping good.  She denies any suicidal thoughts or homicidal thought.  She denies any crying spells or any feeling of hopelessness or worthlessness.  Her appetite is okay.  She recently seen Dr. Jamesetta So and there has been no changes in her medication.  She continued to have paresthesia and tingling and takes gabapentin mostly 100 mg twice a day.  Patient like to get her refills of gabapentin from this office.  Patient denies any paranoia, psychosis, hallucination.  Her energy level is good.  She denies drinking or using any illegal substances.  Her sleep is good.  Visit Diagnosis:    ICD-10-CM   1. GAD (generalized anxiety disorder) F41.1 traZODone (DESYREL) 100 MG tablet    lamoTRIgine (LAMICTAL) 150 MG tablet    Past Psychiatric History: Reviewed. Patient has history of depression and in the past she had tried Wellbutrin, Prozac, Zoloft, amitriptyline and Lexapro. She denies any history of mania, psychosis, hallucination, suicidal attempt or any inpatient psychiatric treatment.  Past Medical History:  Past Medical History:  Diagnosis Date  . Abnormal Pap smear of cervix ~2006  . Anxiety   . Depression   . Fatigue     Past Surgical History:  Procedure Laterality Date  . COLPOSCOPY  ~ 2006   Normal - per pt    Family Psychiatric History: Reviewed.  Family History:   Family History  Adopted: Yes  Family history unknown: Yes    Social History:  Social History   Socioeconomic History  . Marital status: Married    Spouse name: Molly Maduro  . Number of children: 0  . Years of education: 62  . Highest education level: Not on file  Occupational History  . Occupation: Self employed  Social Needs  . Financial resource strain: Not on file  . Food insecurity:    Worry: Not on file    Inability: Not on file  . Transportation needs:    Medical: Not on file    Non-medical: Not on file  Tobacco Use  . Smoking status: Never Smoker  . Smokeless tobacco: Never Used  Substance and Sexual Activity  . Alcohol use: No    Alcohol/week: 0.0 oz  . Drug use: No  . Sexual activity: Yes    Partners: Male    Birth control/protection: Post-menopausal  Lifestyle  . Physical activity:    Days per week: Not on file    Minutes per session: Not on file  . Stress: Not on file  Relationships  . Social connections:    Talks on phone: Not on file    Gets together: Not on file    Attends religious service: Not on file    Active member of club or organization: Not on file    Attends meetings of clubs or organizations: Not on file    Relationship status: Not on  file  Other Topics Concern  . Not on file  Social History Narrative   Right handed    Caffeine use: 1 Mt Dew every morning   Lives with husband    Allergies:  Allergies  Allergen Reactions  . Penicillins     High school  . Amitriptyline Anxiety    No longer taking    Metabolic Disorder Labs: No results found for: HGBA1C, MPG No results found for: PROLACTIN No results found for: CHOL, TRIG, HDL, CHOLHDL, VLDL, LDLCALC No results found for: TSH  Therapeutic Level Labs: No results found for: LITHIUM No results found for: VALPROATE No components found for:  CBMZ  Current Medications: Current Outpatient Medications  Medication Sig Dispense Refill  . Calcium Carbonate-Vit D-Min (CALCIUM 1200  PO) Take by mouth.    . gabapentin (NEURONTIN) 100 MG capsule Take 1 capsule (100 mg total) by mouth 3 (three) times daily. 90 capsule 3  . lamoTRIgine (LAMICTAL) 150 MG tablet Take 1 tablet (150 mg total) by mouth daily. 90 tablet 0  . Multiple Vitamin (MULTIVITAMIN) tablet Take 1 tablet by mouth daily.    . Omega-3 Fatty Acids (FISH OIL PO) Take by mouth daily.    . traZODone (DESYREL) 100 MG tablet Take 1 tablet (100 mg total) by mouth at bedtime. 90 tablet 0   No current facility-administered medications for this visit.      Musculoskeletal: Strength & Muscle Tone: within normal limits Gait & Station: normal Patient leans: N/A  Psychiatric Specialty Exam: Review of Systems  Neurological: Positive for tingling.    Blood pressure 122/70, pulse 65, height 5' 7.5" (1.715 m), weight 148 lb (67.1 kg), last menstrual period 11/28/2012.There is no height or weight on file to calculate BMI.  General Appearance: Casual  Eye Contact:  Good  Speech:  Clear and Coherent  Volume:  Normal  Mood:  Anxious  Affect:  Congruent  Thought Process:  Goal Directed  Orientation:  Full (Time, Place, and Person)  Thought Content: Logical   Suicidal Thoughts:  No  Homicidal Thoughts:  No  Memory:  Immediate;   Good Recent;   Good Remote;   Good  Judgement:  Good  Insight:  Good  Psychomotor Activity:  Normal  Concentration:  Concentration: Fair and Attention Span: Fair  Recall:  Good  Fund of Knowledge: Good  Language: Good  Akathisia:  No  Handed:  Right  AIMS (if indicated): not done  Assets:  Communication Skills Desire for Improvement Housing Resilience  ADL's:  Intact  Cognition: WNL  Sleep:  Good   Screenings: PHQ2-9     Counselor from 08/27/2014 in BEHAVIORAL HEALTH OUTPATIENT THERAPY [place]  PHQ-2 Total Score  4  PHQ-9 Total Score  14       Assessment and Plan: Major depressive disorder, recurrent.  Generalized anxiety disorder.  I reviewed records from the  neurology.  Patient doing better on her medication.  We increase Lamictal 6 months ago and that seems to be working well.  I will continue Lamictal 150 mg daily, trazodone 100 mg at bedtime.  She like to get refills of Neurontin from this office.  Continue Lamictal 100 mg up to 3 times a day.  Recommended to call us back if she has any question or any concern.  If her symptoms of forgetfulness gets worse then I would recommend her to consult the neurology.  Recommended to call us back if she has any question, concern if she feels worsening of the symptoms.  Follow-up in 3 months.     Cleotis Nipper, MD 01/09/2018, 8:12 AM

## 2018-02-14 ENCOUNTER — Ambulatory Visit: Payer: 59 | Admitting: Certified Nurse Midwife

## 2018-02-23 ENCOUNTER — Other Ambulatory Visit: Payer: Self-pay

## 2018-02-23 ENCOUNTER — Encounter: Payer: Self-pay | Admitting: Certified Nurse Midwife

## 2018-02-23 ENCOUNTER — Ambulatory Visit (INDEPENDENT_AMBULATORY_CARE_PROVIDER_SITE_OTHER): Payer: 59 | Admitting: Certified Nurse Midwife

## 2018-02-23 VITALS — BP 104/60 | HR 64 | Resp 16 | Ht 66.25 in | Wt 150.0 lb

## 2018-02-23 DIAGNOSIS — F418 Other specified anxiety disorders: Secondary | ICD-10-CM | POA: Diagnosis not present

## 2018-02-23 DIAGNOSIS — Z01419 Encounter for gynecological examination (general) (routine) without abnormal findings: Secondary | ICD-10-CM | POA: Diagnosis not present

## 2018-02-23 DIAGNOSIS — N951 Menopausal and female climacteric states: Secondary | ICD-10-CM | POA: Diagnosis not present

## 2018-02-23 NOTE — Patient Instructions (Signed)

## 2018-02-23 NOTE — Progress Notes (Signed)
52 y.o. G0P0000 Married  Caucasian Fe here for annual exam. Menopausal symptoms have decreased. No insomnia, sleeping well. Denies vaginal bleeding. Coconut oil working well, Stopped using Vagifem. Seeing behavioral health for  Medication management and  stable with Dr Lolly MustacheArfeen management. Sees PCP Dr. Clelia CroftShaw yearly for labs and aex. All normal per patient. No health concerns today. Will be dog sitting most of the summer!  Patient's last menstrual period was 11/28/2012.          Sexually active: No.  The current method of family planning is post menopausal status.    Exercising: Yes.    walking Smoker:  no  Health Maintenance: Pap:  01-16-15 neg HPV HR neg, 02-10-17 neg History of Abnormal Pap: yes, yrs ago MMG:  04-22-17 category c density birads 1:neg Self Breast exams: yes Colonoscopy:  12/16 f/u 8144yrs BMD:   none TDaP:  2015 Shingles: no Pneumonia: no Hep C and HIV: HIV neg 2018 Labs: with PCP   reports that she has never smoked. She has never used smokeless tobacco. She reports that she does not drink alcohol or use drugs.  Past Medical History:  Diagnosis Date  . Abnormal Pap smear of cervix ~2006  . Anxiety   . Depression   . Fatigue     Past Surgical History:  Procedure Laterality Date  . COLPOSCOPY  ~ 2006   Normal - per pt    Current Outpatient Medications  Medication Sig Dispense Refill  . Calcium Carbonate-Vit D-Min (CALCIUM 1200 PO) Take by mouth.    . gabapentin (NEURONTIN) 100 MG capsule Take 1 capsule (100 mg total) by mouth 3 (three) times daily. 90 capsule 3  . lamoTRIgine (LAMICTAL) 150 MG tablet Take 1 tablet (150 mg total) by mouth daily. 90 tablet 0  . Multiple Vitamin (MULTIVITAMIN) tablet Take 1 tablet by mouth daily.    . Omega-3 Fatty Acids (FISH OIL PO) Take by mouth daily.    . traZODone (DESYREL) 100 MG tablet Take 1 tablet (100 mg total) by mouth at bedtime. 90 tablet 2   No current facility-administered medications for this visit.     Family  History  Adopted: Yes  Family history unknown: Yes    ROS:  Pertinent items are noted in HPI.  Otherwise, a comprehensive ROS was negative.  Exam:   BP 104/60   Pulse 64   Resp 16   Ht 5' 6.25" (1.683 m)   Wt 150 lb (68 kg)   LMP 11/28/2012   BMI 24.03 kg/m  Height: 5' 6.25" (168.3 cm) Ht Readings from Last 3 Encounters:  02/23/18 5' 6.25" (1.683 m)  12/12/17 5' 7.5" (1.715 m)  07/18/17 5' 7.5" (1.715 m)    General appearance: alert, cooperative and appears stated age Head: Normocephalic, without obvious abnormality, atraumatic Neck: no adenopathy, supple, symmetrical, trachea midline and thyroid normal to inspection and palpation Lungs: clear to auscultation bilaterally Breasts: normal appearance, no masses or tenderness, No nipple retraction or dimpling, No nipple discharge or bleeding, No axillary or supraclavicular adenopathy Heart: regular rate and rhythm Abdomen: soft, non-tender; no masses,  no organomegaly Extremities: extremities normal, atraumatic, no cyanosis or edema Skin: Skin color, texture, turgor normal. No rashes or lesions Lymph nodes: Cervical, supraclavicular, and axillary nodes normal. No abnormal inguinal nodes palpated Neurologic: Grossly normal   Pelvic: External genitalia:  no lesions              Urethra:  normal appearing urethra with no masses, tenderness or lesions  Bartholin's and Skene's: normal                 Vagina: normal appearing vagina with normal color and discharge, no lesions              Cervix: anteverted, no cervical motion tenderness and no lesions              Pap taken: No. Bimanual Exam:  Uterus:  normal size, contour, position, consistency, mobility, non-tender and retroverted              Adnexa: normal adnexa and no mass, fullness, tenderness               Rectovaginal: Confirms               Anus:  normal sphincter tone, no lesions  Chaperone present: yes  A:  Well Woman with normal exam  Menopausal  No  HRT  Vaginal dryness with coconut oil working well, stopped Vagifem, does not require refill  Anxiety/depression with Behavioral health management  P:   Reviewed health and wellness pertinent to exam  Aware of need to advise if vaginal bleeding  Will advise if dryness issues change  Continue follow up with MD as indicated  Pap smear: no   counseled on breast self exam, mammography screening, feminine hygiene, adequate intake of calcium and vitamin D, diet and exercise  return annually or prn  An After Visit Summary was printed and given to the patient.

## 2018-03-22 DIAGNOSIS — G629 Polyneuropathy, unspecified: Secondary | ICD-10-CM | POA: Diagnosis not present

## 2018-03-22 DIAGNOSIS — Z0000 Encounter for general adult medical examination without abnormal findings: Secondary | ICD-10-CM | POA: Diagnosis not present

## 2018-03-22 DIAGNOSIS — E7800 Pure hypercholesterolemia, unspecified: Secondary | ICD-10-CM | POA: Diagnosis not present

## 2018-03-23 ENCOUNTER — Other Ambulatory Visit: Payer: Self-pay | Admitting: Certified Nurse Midwife

## 2018-03-23 DIAGNOSIS — Z1231 Encounter for screening mammogram for malignant neoplasm of breast: Secondary | ICD-10-CM

## 2018-04-11 ENCOUNTER — Ambulatory Visit (HOSPITAL_COMMUNITY): Payer: 59 | Admitting: Psychiatry

## 2018-04-11 ENCOUNTER — Encounter (HOSPITAL_COMMUNITY): Payer: Self-pay | Admitting: Psychiatry

## 2018-04-11 DIAGNOSIS — F411 Generalized anxiety disorder: Secondary | ICD-10-CM | POA: Diagnosis not present

## 2018-04-11 MED ORDER — GABAPENTIN 300 MG PO CAPS: 300.0000 mg | ORAL_CAPSULE | Freq: Two times a day (BID) | ORAL | 2 refills | Status: DC

## 2018-04-11 MED ORDER — LAMOTRIGINE 150 MG PO TABS: 150.0000 mg | ORAL_TABLET | Freq: Every day | ORAL | 0 refills | Status: DC

## 2018-04-11 MED ORDER — TRAZODONE HCL 100 MG PO TABS: 100.0000 mg | ORAL_TABLET | Freq: Every day | ORAL | 2 refills | Status: DC

## 2018-04-11 NOTE — Progress Notes (Signed)
BH MD/PA/NP OP Progress Note  04/11/2018 11:49 AM Jaime [name]  MRN:  [id number]  Chief Complaint: patient returns for medication management appointment . HPI:  I am covering for Dr. Lolly MustacheArfeen, who is out of office, patient expresses understanding that she will continue to follow up with Dr. Lolly MustacheArfeen 52 year old married female, employed, lives with husband. History of anxiety disorder, has been diagnosed with GAD. She is prescribed Lamictal , Neurontin, Trazodone. She states she has been doing " OK", but does report an ongoing tendency to be anxious, worry, and in particular about her husband . States he himself is dealing with anxiety issues, but that as opposed to her, he is reluctant to seek medical care . Denies depression, does not endorse significant neuro-vegetative symptoms of depression at this time, denies anhedonia or persistent sadness, and denies any suicidal ideations. Overall , she is tolerating medications well, feels they are partially effective, denies side effects. Visit Diagnosis:    ICD-10-CM   1. GAD (generalized anxiety disorder) F41.1 lamoTRIgine (LAMICTAL) 150 MG tablet    traZODone (DESYREL) 100 MG tablet    Past Psychiatric History:   Past Medical History:  Past Medical History:  Diagnosis Date  . Abnormal Pap smear of cervix ~2006  . Anxiety   . Depression   . Fatigue     Past Surgical History:  Procedure Laterality Date  . COLPOSCOPY  ~ 2006   Normal - per pt    Family Psychiatric History:   Family History:  Family History  Adopted: Yes  Family history unknown: Yes    Social History:  Social History   Socioeconomic History  . Marital status: Married    Spouse name: Molly MaduroRobert  . Number of children: 0  . Years of education: 3212  . Highest education level: Not on file  Occupational History  . Occupation: Self employed  Social Needs  . Financial resource strain: Not on file  . Food insecurity:    Worry: Not on file    Inability: Not on file   . Transportation needs:    Medical: Not on file    Non-medical: Not on file  Tobacco Use  . Smoking status: Never Smoker  . Smokeless tobacco: Never Used  Substance and Sexual Activity  . Alcohol use: No    Alcohol/week: 0.0 standard drinks  . Drug use: No  . Sexual activity: Not Currently    Partners: Male    Birth control/protection: Post-menopausal  Lifestyle  . Physical activity:    Days per week: Not on file    Minutes per session: Not on file  . Stress: Not on file  Relationships  . Social connections:    Talks on phone: Not on file    Gets together: Not on file    Attends religious service: Not on file    Active member of club or organization: Not on file    Attends meetings of clubs or organizations: Not on file    Relationship status: Not on file  Other Topics Concern  . Not on file  Social History Narrative   Right handed    Caffeine use: 1 Mt Dew every morning   Lives with husband    Allergies:  Allergies  Allergen Reactions  . Penicillins     High school  . Amitriptyline Anxiety    No longer taking    Metabolic Disorder Labs: No results found for: HGBA1C, MPG No results found for: PROLACTIN No results found for: CHOL, TRIG,  HDL, CHOLHDL, VLDL, LDLCALC No results found for: TSH  Therapeutic Level Labs: No results found for: LITHIUM No results found for: VALPROATE No components found for:  CBMZ  Current Medications: Current Outpatient Medications  Medication Sig Dispense Refill  . Calcium Carbonate-Vit D-Min (CALCIUM 1200 PO) Take by mouth.    . gabapentin (NEURONTIN) 300 MG capsule Take 1 capsule (300 mg total) by mouth 2 (two) times daily. 60 capsule 2  . lamoTRIgine (LAMICTAL) 150 MG tablet Take 1 tablet (150 mg total) by mouth daily. 90 tablet 0  . Multiple Vitamin (MULTIVITAMIN) tablet Take 1 tablet by mouth daily.    . Omega-3 Fatty Acids (FISH OIL PO) Take by mouth daily.    . traZODone (DESYREL) 100 MG tablet Take 1 tablet (100 mg  total) by mouth at bedtime. 90 tablet 2   No current facility-administered medications for this visit.      Musculoskeletal: Strength & Muscle Tone: within normal limits Gait & Station: normal Patient leans: N/A  Psychiatric Specialty Exam: ROS denies chest pain, denies shortness of breath, denies vomiting   Blood pressure 122/70, pulse 70, height 5' 7.5" (1.715 m), weight 67.1 kg, last menstrual period 11/28/2012.Body mass index is 22.84 kg/m.  General Appearance: Well Groomed  Eye Contact:  Good  Speech:  Normal Rate  Volume:  Normal  Mood:  reports mood is "OK", denies depression  Affect:  appropriate, vaguely anxious, improves during session  Thought Process:  Linear and Descriptions of Associations: Intact  Orientation:  Full (Time, Place, and Person)  Thought Content: no hallucinations, no delusons, not internally preoccupied    Suicidal Thoughts:  No denies suicidal or self injurious ideations, denies homicidal or violent ideations  Homicidal Thoughts:  No  Memory:  recent and remote grossly intact   Judgement:  Other:  present  Insight:  present   Psychomotor Activity:  Normal  Concentration:  Concentration: Good and Attention Span: Good  Recall:  Good  Fund of Knowledge: Good  Language: Good  Akathisia:  Negative  Handed:  Right  AIMS (if indicated): AIMS test not done today  Assets:  Communication Skills Desire for Improvement Resilience  ADL's:  Intact  Cognition: WNL  Sleep:  Good   Screenings: PHQ2-9     Counselor from 08/27/2014 in BEHAVIORAL HEALTH OUTPATIENT THERAPY [place]  PHQ-2 Total Score  4  PHQ-9 Total Score  14       Assessment and Plan: 52 year old married female, returns for medication management, reports history of chronic anxiety / GAD. Reports partial improvement and is doing well in daily activities, but states she continues to worry excessively, to include about her husband, her work. She states medications have been well tolerated  , without side effects, partially helpful.  We discussed opitions- agrees to titrate Neurontin to 300 mgrs BID to address anxiety- side effects, to include risk of sedation , reviewed. Off label use for anxiety reviewed . Continue Lamictal at same dose . We have reviewed side effect profile, to include risk of severe rash/ Trudie BucklerSteven Johnson Syndrome. Patient denies any rash.  We discussed adding an SSRI or SNRI, but currently not interested, states she has tried several antidepressants in the past, to include Zoloft, Celexa, Prozac, and does not feel they helped .  Will see in 2-3 months, agrees to contact clinic/return sooner if any worsening prior   Craige CottaFernando A Cobos, MD 04/11/2018, 11:49 AM

## 2018-04-25 ENCOUNTER — Ambulatory Visit
Admission: RE | Admit: 2018-04-25 | Discharge: 2018-04-25 | Disposition: A | Discharge status: Home / Self Care | Payer: 59 | Source: Ambulatory Visit | Attending: Certified Nurse Midwife | Admitting: Certified Nurse Midwife

## 2018-04-25 DIAGNOSIS — Z1231 Encounter for screening mammogram for malignant neoplasm of breast: Secondary | ICD-10-CM

## 2018-07-12 ENCOUNTER — Ambulatory Visit (HOSPITAL_COMMUNITY): Payer: 59 | Admitting: Psychiatry

## 2018-07-12 ENCOUNTER — Ambulatory Visit (HOSPITAL_COMMUNITY): Payer: Self-pay | Admitting: Psychiatry

## 2018-07-12 ENCOUNTER — Encounter (HOSPITAL_COMMUNITY): Payer: Self-pay | Admitting: Psychiatry

## 2018-07-12 VITALS — BP 136/75 | HR 78 | Ht 67.0 in | Wt 150.0 lb

## 2018-07-12 DIAGNOSIS — F411 Generalized anxiety disorder: Secondary | ICD-10-CM | POA: Diagnosis not present

## 2018-07-12 DIAGNOSIS — F330 Major depressive disorder, recurrent, mild: Secondary | ICD-10-CM | POA: Diagnosis not present

## 2018-07-12 MED ORDER — LAMOTRIGINE 150 MG PO TABS: 150.0000 mg | ORAL_TABLET | Freq: Every day | ORAL | 0 refills | Status: DC

## 2018-07-12 MED ORDER — GABAPENTIN 100 MG PO CAPS: 100.0000 mg | ORAL_CAPSULE | Freq: Three times a day (TID) | ORAL | 1 refills | Status: DC

## 2018-07-12 NOTE — Progress Notes (Signed)
BH MD/PA/NP OP Progress Note  07/12/2018 3:33 PM Jaime [name]  MRN:  [id number]  Chief Complaint: I am having issues since my gabapentin increased.  I feel sometimes loopy.  I forget things.  I am feeling overwhelmed.  HPI: Jaime [name] came for her follow-up appointment.  She was seen Dr. Jama Flavorsobos in my absence and her gabapentin was increased from 100 mg 3 times a day to 300 mg twice a day.  She has noticed dizziness, feeling loopy and forgetful.  Patient told that people noticed that I am repeating myself multiple times.  She is also very concerned about her husband who diagnosed with high blood pressure but he does not see the physician.  She endorsed that she has a chronic anxiety and she gets easily overwhelmed if it is related to her health issues or her husband's health issue.  She told that she cannot make him to see the doctor and that worries her.  Patient denies any suicidal thoughts or homicidal thought.  She is sleeping good.  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  She has no rash, itching, tremors or shakes.  She denies any mania or psychosis.  She denies any feeling of hopelessness or worthlessness but appears easily tearful when she is talking about her husband.  Patient denies drinking or using any illegal substances.    Visit Diagnosis:    ICD-10-CM   1. MDD (major depressive disorder), recurrent episode, mild (HCC) F33.0 lamoTRIgine (LAMICTAL) 150 MG tablet  2. GAD (generalized anxiety disorder) F41.1 gabapentin (NEURONTIN) 100 MG capsule    lamoTRIgine (LAMICTAL) 150 MG tablet    Past Psychiatric History: Reviewed. Patient has history of depression and in the past she had tried Wellbutrin, Prozac, Zoloft, amitriptyline and Lexapro. She denies any history of mania, psychosis, hallucination, suicidal attempt or any inpatient psychiatric treatment.  Past Medical History:   Past Medical History:  Diagnosis Date  . Abnormal Pap smear of cervix ~2006  . Anxiety   . Depression   . Fatigue     Past Surgical History:  Procedure Laterality Date  . COLPOSCOPY  ~ 2006   Normal - per pt    Family Psychiatric History: Reviewed.  Family History:  Family History  Adopted: Yes  Problem Relation Age of Onset  . Breast cancer Other     Social History:  Social History   Socioeconomic History  . Marital status: Married    Spouse name: Molly MaduroRobert  . Number of children: 0  . Years of education: 4612  . Highest education level: Not on file  Occupational History  . Occupation: Self employed  Social Needs  . Financial resource strain: Not on file  . Food insecurity:    Worry: Not on file    Inability: Not on file  . Transportation needs:    Medical: Not on file    Non-medical: Not on file  Tobacco Use  . Smoking status: Never Smoker  . Smokeless tobacco: Never Used  Substance and Sexual Activity  . Alcohol use: No    Alcohol/week: 0.0 standard drinks  . Drug use: No  . Sexual activity: Not Currently    Partners: Male    Birth control/protection: Post-menopausal  Lifestyle  . Physical activity:    Days per week: Not on file    Minutes per session: Not on file  . Stress: Not on  file  Relationships  . Social connections:    Talks on phone: Not on file    Gets together: Not on file    Attends religious service: Not on file    Active member of club or organization: Not on file    Attends meetings of clubs or organizations: Not on file    Relationship status: Not on file  Other Topics Concern  . Not on file  Social History Narrative   Right handed    Caffeine use: 1 Mt Dew every morning   Lives with husband    Allergies:  Allergies  Allergen Reactions  . Penicillins     High school  . Amitriptyline Anxiety    No longer taking    Metabolic Disorder Labs: No results found for: HGBA1C, MPG No results found for: PROLACTIN No results found  for: CHOL, TRIG, HDL, CHOLHDL, VLDL, LDLCALC No results found for: TSH  Therapeutic Level Labs: No results found for: LITHIUM No results found for: VALPROATE No components found for:  CBMZ  Current Medications: Current Outpatient Medications  Medication Sig Dispense Refill  . Calcium Carbonate-Vit D-Min (CALCIUM 1200 PO) Take by mouth.    . gabapentin (NEURONTIN) 300 MG capsule Take 1 capsule (300 mg total) by mouth 2 (two) times daily. 60 capsule 2  . lamoTRIgine (LAMICTAL) 150 MG tablet Take 1 tablet (150 mg total) by mouth daily. 90 tablet 0  . Multiple Vitamin (MULTIVITAMIN) tablet Take 1 tablet by mouth daily.    . Omega-3 Fatty Acids (FISH OIL PO) Take by mouth daily.    . traZODone (DESYREL) 100 MG tablet Take 1 tablet (100 mg total) by mouth at bedtime. 90 tablet 2   No current facility-administered medications for this visit.      Musculoskeletal: Strength & Muscle Tone: within normal limits Gait & Station: normal Patient leans: N/A  Psychiatric Specialty Exam: ROS  Blood pressure 136/75, pulse 78, height 5\' 7"  (1.702 m), weight 150 lb (68 kg), last menstrual period 11/28/2012, SpO2 98 %.Body mass index is 23.49 kg/m.  General Appearance: Casual and tearfull  Eye Contact:  Good  Speech:  Clear and Coherent  Volume:  Normal  Mood:  Anxious and Dysphoric  Affect:  Congruent  Thought Process:  Goal Directed  Orientation:  Full (Time, Place, and Person)  Thought Content: Rumination   Suicidal Thoughts:  No  Homicidal Thoughts:  No  Memory:  Immediate;   Fair Recent;   Good Remote;   Good  Judgement:  Good  Insight:  Good  Psychomotor Activity:  Increased  Concentration:  Concentration: Fair and Attention Span: Fair  Recall:  Good  Fund of Knowledge: Good  Language: Good  Akathisia:  No  Handed:  Right  AIMS (if indicated): not done  Assets:  Communication Skills Desire for Improvement Resilience  ADL's:  Intact  Cognition: WNL  Sleep:  Good    Screenings: PHQ2-9     Counselor from 08/27/2014 in BEHAVIORAL HEALTH OUTPATIENT THERAPY [place]  PHQ-2 Total Score  4  PHQ-9 Total Score  14       Assessment and Plan: Major depressive disorder, recurrent.  Generalized anxiety disorder.  Reassurance given.  Recommended to go back on Neurontin 100 mg 3 times a day and if she does not see any improvement call us back.  She is not interested in therapy.  Most likely she is having side effects from higher gabapentin dose however if symptoms do not improve we will consider increasing Lamictal.  Recommended to call us back if she has any question or any concern.  Continue trazodone 100 mg daily, Lamictal 150 mg daily and new dose of gabapentin 100 mg 3 times a day.  Follow-up in 2 months.   Cleotis Nipper, MD 07/12/2018, 3:33 PM

## 2018-09-12 ENCOUNTER — Ambulatory Visit (HOSPITAL_COMMUNITY): Payer: 59 | Admitting: Psychiatry

## 2018-10-10 ENCOUNTER — Other Ambulatory Visit (HOSPITAL_COMMUNITY): Payer: Self-pay

## 2018-10-10 DIAGNOSIS — F411 Generalized anxiety disorder: Secondary | ICD-10-CM

## 2018-10-10 DIAGNOSIS — F330 Major depressive disorder, recurrent, mild: Secondary | ICD-10-CM

## 2018-10-10 MED ORDER — LAMOTRIGINE 150 MG PO TABS: 150.0000 mg | ORAL_TABLET | Freq: Every day | ORAL | 0 refills | Status: DC

## 2018-10-10 MED ORDER — GABAPENTIN 100 MG PO CAPS: 100.0000 mg | ORAL_CAPSULE | Freq: Three times a day (TID) | ORAL | 0 refills | Status: DC

## 2018-10-17 ENCOUNTER — Encounter: Payer: Self-pay | Admitting: Certified Nurse Midwife

## 2018-10-17 ENCOUNTER — Ambulatory Visit: Payer: 59 | Admitting: Certified Nurse Midwife

## 2018-10-17 ENCOUNTER — Other Ambulatory Visit: Payer: Self-pay

## 2018-10-17 VITALS — BP 110/64 | Temp 98.3°F | Wt 151.0 lb

## 2018-10-17 DIAGNOSIS — N390 Urinary tract infection, site not specified: Secondary | ICD-10-CM

## 2018-10-17 DIAGNOSIS — R319 Hematuria, unspecified: Secondary | ICD-10-CM

## 2018-10-17 LAB — POCT URINALYSIS DIPSTICK
Bilirubin, UA: NEGATIVE
Glucose, UA: NEGATIVE
Ketones, UA: NEGATIVE
Leukocytes, UA: NEGATIVE
Nitrite, UA: NEGATIVE
Urobilinogen, UA: NEGATIVE E.U./dL — AB
pH, UA: 5 (ref 5.0–8.0)

## 2018-10-17 MED ORDER — NITROFURANTOIN MONOHYD MACRO 100 MG PO CAPS: 100.0000 mg | ORAL_CAPSULE | Freq: Two times a day (BID) | ORAL | 0 refills | Status: DC

## 2018-10-17 MED ORDER — PHENAZOPYRIDINE HCL 100 MG PO TABS: 100.0000 mg | ORAL_TABLET | Freq: Three times a day (TID) | ORAL | 0 refills | Status: DC | PRN

## 2018-10-17 NOTE — Progress Notes (Signed)
53 y.o. Married Caucasian female [id number] here with complaint of UTI, with onset one week ago with vaginal dryness.. Patient complaining of urinary frequency/urgency/ and pain with urination. Patient denies fever, chills, nausea or back pain. Had nausea this am, no vomiting. No new personal products. Patient feels not related to sexual activity. Denies any vaginal symptoms.  Contraception is menopausal . Menopausal with vaginal dryness, using coconut oil with good results.. Patient is drinking adequate water intake now. No other health issues today.  Review of Systems  Constitutional: Negative.   HENT: Negative.   Eyes: Negative.   Respiratory: Negative.   Cardiovascular: Negative.   Gastrointestinal: Negative.   Genitourinary: Positive for dysuria, frequency and urgency.  Musculoskeletal: Negative.   Skin: Negative.   Neurological: Negative.   Endo/Heme/Allergies: Negative.   Psychiatric/Behavioral: Negative.     O: Healthy female WDWN Affect: Normal, orientation x 3 Skin : warm and dry CVAT: negative bilateral Abdomen: positive for suprapubic tenderness  Pelvic exam: External genital area: normal, no lesions Bladder,Urethra tender, Urethral meatus: tender, red Vagina: normal vaginal discharge, normal appearance  Cervix: normal, non tender Uterus:normal,non tender Adnexa: normal non tender, no fullness or masses   A: UTI Normal pelvic exam poct urine-rbc tr  P: Reviewed findings of UTI and need for treatment. SA:YTKZSWFU 100 mg  See order with instructions Rx Pyridium 100 mg see order with instructions XNA:TFTDD micro, culture Reviewed warning signs and symptoms of UTI and need to advise if occurring. Encouraged to limit soda, tea, and coffee and be sure to increase water intake.   RV prn

## 2018-10-17 NOTE — Patient Instructions (Signed)
Urinary Tract Infection, Adult A urinary tract infection (UTI) is an infection of any part of the urinary tract. The urinary tract includes:  The kidneys.  The ureters.  The bladder.  The urethra. These organs make, store, and get rid of pee (urine) in the body. What are the causes? This is caused by germs (bacteria) in your genital area. These germs grow and cause swelling (inflammation) of your urinary tract. What increases the risk? You are more likely to develop this condition if:  You have a small, thin tube (catheter) to drain pee.  You cannot control when you pee or poop (incontinence).  You are female, and: ? You use these methods to prevent pregnancy: ? A medicine that kills sperm (spermicide). ? A device that blocks sperm (diaphragm). ? You have low levels of a female hormone (estrogen). ? You are pregnant.  You have genes that add to your risk.  You are sexually active.  You take antibiotic medicines.  You have trouble peeing because of: ? A prostate that is bigger than normal, if you are female. ? A blockage in the part of your body that drains pee from the bladder (urethra). ? A kidney stone. ? A nerve condition that affects your bladder (neurogenic bladder). ? Not getting enough to drink. ? Not peeing often enough.  You have other conditions, such as: ? Diabetes. ? A weak disease-fighting system (immune system). ? Sickle cell disease. ? Gout. ? Injury of the spine. What are the signs or symptoms? Symptoms of this condition include:  Needing to pee right away (urgently).  Peeing often.  Peeing small amounts often.  Pain or burning when peeing.  Blood in the pee.  Pee that smells bad or not like normal.  Trouble peeing.  Pee that is cloudy.  Fluid coming from the vagina, if you are female.  Pain in the belly or lower back. Other symptoms include:  Throwing up (vomiting).  No urge to eat.  Feeling mixed up (confused).  Being tired  and grouchy (irritable).  A fever.  Watery poop (diarrhea). How is this treated? This condition may be treated with:  Antibiotic medicine.  Other medicines.  Drinking enough water. Follow these instructions at home:  Medicines  Take over-the-counter and prescription medicines only as told by your doctor.  If you were prescribed an antibiotic medicine, take it as told by your doctor. Do not stop taking it even if you start to feel better. General instructions  Make sure you: ? Pee until your bladder is empty. ? Do not hold pee for a long time. ? Empty your bladder after sex. ? Wipe from front to back after pooping if you are a female. Use each tissue one time when you wipe.  Drink enough fluid to keep your pee pale yellow.  Keep all follow-up visits as told by your doctor. This is important. Contact a doctor if:  You do not get better after 1-2 days.  Your symptoms go away and then come back. Get help right away if:  You have very bad back pain.  You have very bad pain in your lower belly.  You have a fever.  You are sick to your stomach (nauseous).  You are throwing up. Summary  A urinary tract infection (UTI) is an infection of any part of the urinary tract.  This condition is caused by germs in your genital area.  There are many risk factors for a UTI. These include having a small, thin   tube to drain pee and not being able to control when you pee or poop.  Treatment includes antibiotic medicines for germs.  Drink enough fluid to keep your pee pale yellow. This information is not intended to replace advice given to you by your health care provider. Make sure you discuss any questions you have with your health care provider. Document Released: 02/02/2008 Document Revised: 02/23/2018 Document Reviewed: 02/23/2018 Elsevier Interactive Patient Education  2019 Elsevier Inc.  

## 2018-10-18 LAB — URINALYSIS, MICROSCOPIC ONLY
Bacteria, UA: NONE SEEN
Casts: NONE SEEN /lpf

## 2018-10-19 LAB — URINE CULTURE

## 2018-10-23 ENCOUNTER — Encounter (HOSPITAL_COMMUNITY): Payer: Self-pay | Admitting: Psychiatry

## 2018-10-23 ENCOUNTER — Ambulatory Visit (HOSPITAL_COMMUNITY): Payer: 59 | Admitting: Psychiatry

## 2018-10-23 DIAGNOSIS — F330 Major depressive disorder, recurrent, mild: Secondary | ICD-10-CM | POA: Diagnosis not present

## 2018-10-23 DIAGNOSIS — F411 Generalized anxiety disorder: Secondary | ICD-10-CM | POA: Diagnosis not present

## 2018-10-23 MED ORDER — GABAPENTIN 100 MG PO CAPS: 100.0000 mg | ORAL_CAPSULE | Freq: Three times a day (TID) | ORAL | 0 refills | Status: DC

## 2018-10-23 MED ORDER — LAMOTRIGINE 150 MG PO TABS: 150.0000 mg | ORAL_TABLET | Freq: Every day | ORAL | 0 refills | Status: DC

## 2018-10-23 MED ORDER — TRAZODONE HCL 100 MG PO TABS: 100.0000 mg | ORAL_TABLET | Freq: Every day | ORAL | 0 refills | Status: DC

## 2018-10-23 NOTE — Progress Notes (Signed)
BH MD/PA/NP OP Progress Note  10/23/2018 9:45 AM Jaime [name]  MRN:  [id number]  Chief Complaint: I am doing better since dose decreased.  HPI: Jaime [name] came for her appointment.  She is doing much better since gabapentin dose decreased from 300 mg to 200 mg.  She is taking 3 times a day.  She does not feel any more dizziness, forgetful or feeling loopy.  She is a still concerned about her husband who has health issues but he has been doing better than before.  Recently patient has UTI and she was given medication.  She is feeling better now.  Her sleep is good.  She denies any panic attack or any crying spells however she has chronic depression and she gets easily overwhelmed and she worries about her future a lot.  She denies any suicidal thoughts or homicidal thought.  She is only cleaning 1 house as it seems to be not overwhelm for her.  She likes Lamictal and gabapentin.  She has no rash, itching, tremors or shakes.  She denies any feeling of hopelessness or worthlessness but sometimes gets emotional when she talks about her husband.  She is trying to be more punctual with church.  Her appetite is okay.  Visit Diagnosis:    ICD-10-CM   1. GAD (generalized anxiety disorder) F41.1 traZODone (DESYREL) 100 MG tablet    lamoTRIgine (LAMICTAL) 150 MG tablet    gabapentin (NEURONTIN) 100 MG capsule  2. MDD (major depressive disorder), recurrent episode, mild (HCC) F33.0 lamoTRIgine (LAMICTAL) 150 MG tablet    Past Psychiatric History: Reviewed. H/O depression and Anxiety. Tried Wellbutrin, Prozac, Zoloft, amitriptyline and Lexapro. No H/O mania, psychosis, hallucination, suicidal attempt and inpatient psychiatric treatment.  Past Medical History:  Past Medical History:  Diagnosis Date  . Abnormal Pap smear of cervix ~2006  . Anxiety   . Depression   . Fatigue     Past Surgical History:  Procedure Laterality Date  . COLPOSCOPY  ~ 2006   Normal - per pt    Family Psychiatric  History: Reviewed.  Family History:  Family History  Adopted: Yes  Problem Relation Age of Onset  . Breast cancer Other     Social History:  Social History   Socioeconomic History  . Marital status: Married    Spouse name: Molly Maduro  . Number of children: 0  . Years of education: 19  . Highest education level: Not on file  Occupational History  . Occupation: Self employed  Social Needs  . Financial resource strain: Not on file  . Food insecurity:    Worry: Not on file    Inability: Not on file  . Transportation needs:    Medical: Not on file    Non-medical: Not on file  Tobacco Use  . Smoking status: Never Smoker  . Smokeless tobacco: Never Used  Substance and Sexual Activity  . Alcohol use: No    Alcohol/week: 0.0 standard drinks  . Drug use: No  . Sexual activity: Not Currently    Partners: Male    Birth control/protection: Post-menopausal  Lifestyle  . Physical activity:    Days per week: Not on file    Minutes per session: Not on file  . Stress: Not on file  Relationships  . Social connections:    Talks on phone: Not on file    Gets together: Not on file    Attends religious service: Not on file    Active member of club or organization: Not  on file    Attends meetings of clubs or organizations: Not on file    Relationship status: Not on file  Other Topics Concern  . Not on file  Social History Narrative   Right handed    Caffeine use: 1 Mt Dew every morning   Lives with husband    Allergies:  Allergies  Allergen Reactions  . Penicillins     High school  . Amitriptyline Anxiety    No longer taking    Metabolic Disorder Labs: No results found for: HGBA1C, MPG No results found for: PROLACTIN No results found for: CHOL, TRIG, HDL, CHOLHDL, VLDL, LDLCALC No results found for: TSH  Therapeutic Level Labs: No results found for: LITHIUM No results found for: VALPROATE No components found for:  CBMZ  Current Medications: Current Outpatient  Medications  Medication Sig Dispense Refill  . Calcium Carbonate-Vit D-Min (CALCIUM 1200 PO) Take by mouth.    . gabapentin (NEURONTIN) 100 MG capsule Take 1 capsule (100 mg total) by mouth 3 (three) times daily. 90 capsule 0  . lamoTRIgine (LAMICTAL) 150 MG tablet Take 1 tablet (150 mg total) by mouth daily. 90 tablet 0  . Multiple Vitamin (MULTIVITAMIN) tablet Take 1 tablet by mouth daily.    . nitrofurantoin, macrocrystal-monohydrate, (MACROBID) 100 MG capsule Take 1 capsule (100 mg total) by mouth 2 (two) times daily. 14 capsule 0  . Omega-3 Fatty Acids (FISH OIL PO) Take by mouth daily.    . phenazopyridine (PYRIDIUM) 100 MG tablet Take 1 tablet (100 mg total) by mouth 3 (three) times daily as needed for pain. 12 tablet 0  . traZODone (DESYREL) 100 MG tablet Take 1 tablet (100 mg total) by mouth at bedtime. 90 tablet 2   No current facility-administered medications for this visit.      Musculoskeletal: Strength & Muscle Tone: within normal limits Gait & Station: normal Patient leans: N/A  Psychiatric Specialty Exam: ROS  Blood pressure (!) 154/68, pulse 70, height 5\' 7"  (1.702 m), weight 151 lb (68.5 kg), last menstrual period 11/28/2012, SpO2 98 %.There is no height or weight on file to calculate BMI.  General Appearance: Casual  Eye Contact:  Good  Speech:  Clear and Coherent  Volume:  Normal  Mood:  Anxious  Affect:  Congruent  Thought Process:  Goal Directed  Orientation:  Full (Time, Place, and Person)  Thought Content: Logical   Suicidal Thoughts:  No  Homicidal Thoughts:  No  Memory:  Immediate;   Good Recent;   Good Remote;   Good  Judgement:  Good  Insight:  Good  Psychomotor Activity:  Normal  Concentration:  Concentration: Good and Attention Span: Fair  Recall:  Good  Fund of Knowledge: Good  Language: Good  Akathisia:  No  Handed:  Right  AIMS (if indicated): not done  Assets:  Communication Skills Desire for Improvement Housing Resilience Social  Support Talents/Skills  ADL's:  Intact  Cognition: WNL  Sleep:  Good   Screenings: PHQ2-9     Counselor from 08/27/2014 in BEHAVIORAL HEALTH OUTPATIENT THERAPY [place]  PHQ-2 Total Score  4  PHQ-9 Total Score  14       Assessment and Plan: Major depressive disorder, recurrent.  Generalized anxiety disorder.  Patient doing better since gabapentin dose decreased.  She like to continue current regime.  She has no rash or any itching.  She is not interested in therapy.  I will continue Lamictal 150 mg daily and gabapentin 100 mg 3 times a  day.  Discussed medication side effects and benefits.  Recommended to call us back if she has any question or any concern.  Follow-up in 3 months.   Cleotis Nipper, MD 10/23/2018, 9:45 AM

## 2019-01-24 ENCOUNTER — Ambulatory Visit (INDEPENDENT_AMBULATORY_CARE_PROVIDER_SITE_OTHER): Payer: BLUE CROSS/BLUE SHIELD | Admitting: Psychiatry

## 2019-01-24 ENCOUNTER — Other Ambulatory Visit: Payer: Self-pay

## 2019-01-24 ENCOUNTER — Encounter (HOSPITAL_COMMUNITY): Payer: Self-pay | Admitting: Psychiatry

## 2019-01-24 DIAGNOSIS — F411 Generalized anxiety disorder: Secondary | ICD-10-CM | POA: Diagnosis not present

## 2019-01-24 DIAGNOSIS — F330 Major depressive disorder, recurrent, mild: Secondary | ICD-10-CM | POA: Diagnosis not present

## 2019-01-24 MED ORDER — GABAPENTIN 100 MG PO CAPS: 100.0000 mg | ORAL_CAPSULE | Freq: Three times a day (TID) | ORAL | 0 refills | Status: DC

## 2019-01-24 MED ORDER — TRAZODONE HCL 100 MG PO TABS: ORAL_TABLET | ORAL | 0 refills | Status: DC

## 2019-01-24 MED ORDER — LAMOTRIGINE 200 MG PO TABS: 200.0000 mg | ORAL_TABLET | Freq: Every day | ORAL | 0 refills | Status: DC

## 2019-01-24 NOTE — Progress Notes (Signed)
Virtual Visit via Telephone Note  I connected with Jaime [name] on 01/24/19 at  8:20 AM EDT by telephone and verified that I am speaking with the correct person using two identifiers.   I discussed the limitations, risks, security and privacy concerns of performing an evaluation and management service by telephone and the availability of in person appointments. I also discussed with the patient that there may be a patient responsible charge related to this service. The patient expressed understanding and agreed to proceed.   History of Present Illness: Patient was evaluated by phone session.  Patient told she has been experiencing a lot of anxiety, crying spells and irritability since COVID-19.  Patient told Jaime [name] is currently unemployed and having a lot of issues with unemployment and financial burden.  She is working but only 1 house and not able to make enough money.  She admitted getting easily irritable,.  She is having racing thoughts, mood swings, getting easily emotional and upset.  Though she denies any suicidal thoughts or homicidal thought.  She is taking gabapentin, Lamictal and trazodone.  She reported no tremors, shakes, rash or any itching.  She is sleeping 5 hours but sometimes is having a lot of racing thoughts.  She denies any feeling of hopelessness or helplessness.  She is hoping her husband Jaime [name] get job soon to [employer] financial strain.  She is also not happy that not able to go back to church due to COVID-19.  Patient told her church is going to open on June 7 but she is scared to go.  Patient reported her appetite is okay and her weight is stable but her energy level is low and she required a lot of motivation to do things.  Patient denies drinking or using any illegal substances.   Past Psychiatric History: Reviewed. H/O depression and Anxiety. Tried Wellbutrin, Prozac, Zoloft, amitriptyline and Lexapro. No H/O mania, psychosis, hallucination, suicidal attempt and inpatient  psychiatric treatment.   Psychiatric Specialty Exam: Physical Exam  ROS  Last menstrual period 11/28/2012.There is no height or weight on file to calculate BMI.  General Appearance: NA  Eye Contact:  NA  Speech:  Normal Rate  Volume:  Normal  Mood:  Anxious and emotional  Affect:  NA  Thought Process:  Descriptions of Associations: Intact  Orientation:  Full (Time, Place, and Person)  Thought Content:  Rumination  Suicidal Thoughts:  No  Homicidal Thoughts:  No  Memory:  Immediate;   Good Recent;   Good Remote;   Good  Judgement:  Good  Insight:  Good  Psychomotor Activity:  NA  Concentration:  Concentration: Fair and Attention Span: Fair  Recall:  Good  Fund of Knowledge:  Good  Language:  Good  Akathisia:  NA  Handed:  Right  AIMS (if indicated):     Assets:  Communication Skills Desire for Improvement Housing Resilience Social Support Talents/Skills  ADL's:  Intact  Cognition:  WNL  Sleep:   5 hours     Assessment and Plan: Major depressive disorder, recurrent.  Generalized anxiety disorder.  Reassurance given about current situation.  Recommend to try Lamictal 200 since she is tolerating very well without any tremors, shakes, rash or any itching.  I also recommend to take trazodone 100 mg -150 mg to help insomnia.  I also offered therapy but she is not interested.  Reminded medication side effects and benefits specially Lamictal can cause rash.  Continue gabapentin 100 mg 3 times a day.  In the  past she had tried higher dose of gabapentin but it make her very dizzy.  Discussed safety concerns and anytime having active suicidal thoughts or homicidal thought and she need to call 911 or go to local emergency room.  Follow-up in 3 months.  Follow Up Instructions:    I discussed the assessment and treatment plan with the patient. The patient was provided an opportunity to ask questions and all were answered. The patient agreed with the plan and demonstrated an  understanding of the instructions.   The patient was advised to call back or seek an in-person evaluation if the symptoms worsen or if the condition fails to improve as anticipated.  I provided 25 minutes of non-face-to-face time during this encounter.   Cleotis Nipper, MD

## 2019-02-23 ENCOUNTER — Other Ambulatory Visit: Payer: Self-pay

## 2019-02-27 ENCOUNTER — Other Ambulatory Visit: Payer: Self-pay

## 2019-02-27 ENCOUNTER — Other Ambulatory Visit (HOSPITAL_COMMUNITY)
Admission: RE | Admit: 2019-02-27 | Discharge: 2019-02-27 | Disposition: A | Discharge status: Home / Self Care | Payer: BC Managed Care – PPO | Source: Ambulatory Visit | Attending: Certified Nurse Midwife | Admitting: Certified Nurse Midwife

## 2019-02-27 ENCOUNTER — Encounter: Payer: Self-pay | Admitting: Certified Nurse Midwife

## 2019-02-27 ENCOUNTER — Ambulatory Visit: Payer: BC Managed Care – PPO | Admitting: Certified Nurse Midwife

## 2019-02-27 VITALS — BP 110/70 | HR 68 | Temp 97.6°F | Resp 16 | Ht 66.5 in | Wt 143.0 lb

## 2019-02-27 DIAGNOSIS — Z8659 Personal history of other mental and behavioral disorders: Secondary | ICD-10-CM | POA: Diagnosis not present

## 2019-02-27 DIAGNOSIS — N951 Menopausal and female climacteric states: Secondary | ICD-10-CM

## 2019-02-27 DIAGNOSIS — Z01419 Encounter for gynecological examination (general) (routine) without abnormal findings: Secondary | ICD-10-CM | POA: Diagnosis not present

## 2019-02-27 DIAGNOSIS — Z124 Encounter for screening for malignant neoplasm of cervix: Secondary | ICD-10-CM

## 2019-02-27 NOTE — Progress Notes (Signed)
53 y.o. G0P0000 Married  Caucasian Fe here for annual exam. Menopausal no HRT. Occasional hot flash and night sweats. Sees Psychiatry for  Medication management for depression/anxiety. Sees PCP Dr. Brigitte Pulse for aex and labs. Coconut oil working well for vaginal dryness. Still doing dog sitting and enjoying it. No other health issues today.  Patient's last menstrual period was 11/28/2012.          Sexually active: No.  The current method of family planning is post menopausal status.    Exercising: Yes.    walking Smoker:  no  Review of Systems  Constitutional: Negative.   HENT: Negative.   Eyes: Negative.   Respiratory: Negative.   Cardiovascular: Negative.   Gastrointestinal: Negative.   Genitourinary: Negative.   Musculoskeletal: Negative.   Skin: Negative.   Neurological: Negative.   Endo/Heme/Allergies: Negative.   Psychiatric/Behavioral: Negative.     Health Maintenance: Pap:  01-16-15 neg HPV HR neg, 02-10-17 neg History of Abnormal Pap: yes MMG:  04-25-18 category c density birads 1:neg Self Breast exams: yes Colonoscopy:  12/16 f/u 11yrs BMD:   none TDaP:  2015 Shingles: no Pneumonia: no Hep C and HIV: HIV neg 2018 Labs: with PCP   reports that she has never smoked. She has never used smokeless tobacco. She reports that she does not drink alcohol or use drugs.  Past Medical History:  Diagnosis Date  . Abnormal Pap smear of cervix ~2006  . Anxiety   . Depression   . Fatigue     Past Surgical History:  Procedure Laterality Date  . COLPOSCOPY  ~ 2006   Normal - per pt    Current Outpatient Medications  Medication Sig Dispense Refill  . Calcium Carbonate-Vit D-Min (CALCIUM 1200 PO) Take by mouth.    . gabapentin (NEURONTIN) 100 MG capsule Take 1 capsule (100 mg total) by mouth 3 (three) times daily. 270 capsule 0  . lamoTRIgine (LAMICTAL) 200 MG tablet Take 1 tablet (200 mg total) by mouth daily. 90 tablet 0  . Multiple Vitamin (MULTIVITAMIN) tablet Take 1 tablet  by mouth daily.    . Omega-3 Fatty Acids (FISH OIL PO) Take by mouth daily.    . phenazopyridine (PYRIDIUM) 100 MG tablet Take 1 tablet (100 mg total) by mouth 3 (three) times daily as needed for pain. (Patient not taking: Reported on 10/23/2018) 12 tablet 0  . traZODone (DESYREL) 100 MG tablet Take 100-150 mg at bed time 135 tablet 0   No current facility-administered medications for this visit.     Family History  Adopted: Yes  Problem Relation Age of Onset  . Breast cancer Other     ROS:  Pertinent items are noted in HPI.  Otherwise, a comprehensive ROS was negative.  Exam:   LMP 11/28/2012    Ht Readings from Last 3 Encounters:  02/23/18 5' 6.25" (1.683 m)  12/12/17 5' 7.5" (1.715 m)  07/18/17 5' 7.5" (1.715 m)    General appearance: alert, cooperative and appears stated age Head: Normocephalic, without obvious abnormality, atraumatic Neck: no adenopathy, supple, symmetrical, trachea midline and thyroid normal to inspection and palpation Lungs: clear to auscultation bilaterally Breasts: normal appearance, no masses or tenderness, No nipple retraction or dimpling, No nipple discharge or bleeding, No axillary or supraclavicular adenopathy Heart: regular rate and rhythm Abdomen: soft, non-tender; no masses,  no organomegaly Extremities: extremities normal, atraumatic, no cyanosis or edema Skin: Skin color, texture, turgor normal. No rashes or lesions Lymph nodes: Cervical, supraclavicular, and axillary nodes normal.  No abnormal inguinal nodes palpated Neurologic: Grossly normal   Pelvic: External genitalia:  no lesions              Urethra:  normal appearing urethra with no masses, tenderness or lesions              Bartholin's and Skene's: normal                 Vagina: normal appearing vagina with normal color and discharge, no lesions              Cervix: no cervical motion tenderness and no lesions              Pap taken: Yes.   Bimanual Exam:  Uterus:  normal size,  contour, position, consistency, mobility, non-tender and anteverted              Adnexa: normal adnexa and no mass, fullness, tenderness               Rectovaginal: Confirms               Anus:  normal sphincter tone, no lesions  Chaperone present: yes  A:  Well Woman with normal exam  Menopausal no HRT  Depression/anxiety with Psychiatry management, all stable     P:   Reviewed health and wellness pertinent to exam  Stressed importance of notifying with vaginal bleeding.  Continue follow up with MD as indicated  Pap smear: yes   counseled on breast self exam, mammography screening, feminine hygiene, menopause, adequate intake of calcium and vitamin D, diet and exercise  return annually or prn  An After Visit Summary was printed and given to the patient.

## 2019-02-28 LAB — CYTOLOGY - PAP
Diagnosis: NEGATIVE
HPV: NOT DETECTED

## 2019-04-20 ENCOUNTER — Other Ambulatory Visit: Payer: Self-pay | Admitting: Certified Nurse Midwife

## 2019-04-20 DIAGNOSIS — Z1231 Encounter for screening mammogram for malignant neoplasm of breast: Secondary | ICD-10-CM

## 2019-04-23 ENCOUNTER — Encounter (HOSPITAL_COMMUNITY): Payer: Self-pay | Admitting: Psychiatry

## 2019-04-23 ENCOUNTER — Ambulatory Visit (INDEPENDENT_AMBULATORY_CARE_PROVIDER_SITE_OTHER): Payer: BC Managed Care – PPO | Admitting: Psychiatry

## 2019-04-23 ENCOUNTER — Other Ambulatory Visit: Payer: Self-pay

## 2019-04-23 DIAGNOSIS — F411 Generalized anxiety disorder: Secondary | ICD-10-CM

## 2019-04-23 DIAGNOSIS — F330 Major depressive disorder, recurrent, mild: Secondary | ICD-10-CM | POA: Diagnosis not present

## 2019-04-23 MED ORDER — GABAPENTIN 100 MG PO CAPS: 100.0000 mg | ORAL_CAPSULE | Freq: Four times a day (QID) | ORAL | 0 refills | Status: DC

## 2019-04-23 MED ORDER — LAMOTRIGINE 200 MG PO TABS: 200.0000 mg | ORAL_TABLET | Freq: Every day | ORAL | 0 refills | Status: DC

## 2019-04-23 MED ORDER — TRAZODONE HCL 100 MG PO TABS: ORAL_TABLET | ORAL | 0 refills | Status: DC

## 2019-04-23 NOTE — Progress Notes (Signed)
Virtual Visit via Telephone Note  I connected with Jaime [name] on 04/23/19 at  9:00 AM EDT by telephone and verified that I am speaking with the correct person using two identifiers.   I discussed the limitations, risks, security and privacy concerns of performing an evaluation and management service by telephone and the availability of in person appointments. I also discussed with the patient that there may be a patient responsible charge related to this service. The patient expressed understanding and agreed to proceed.   History of Present Illness: Patient was evaluated by phone session.  On her last visit we increase Lamictal which was working very well but she find out that her best friend from third grade diagnosed with brain tumor.  She having a hard time dealing with the news.  She told that her best friend is like sister to her and the news is devastated.  She admitted having crying spells, increased anxiety, poor sleep.  She is taking trazodone most of the night 150 mg.  She is sleeping 5 to 6 hours.  She admitted her racing thoughts and anxiety coming back.  She also endorsed financial strain as husband is currently unemployed but hoping to resume work soon but the places still close.  She started going to church.  She had a good social network which includes her friends and family.  Her appetite is okay.  Her energy level is fair.  She denies any paranoia, hallucination, severe anger or irritability.  She has no tremors, shakes or any rash.   Past Psychiatric History:Reviewed. H/Odepression andAnxiety. TriedWellbutrin, Prozac, Zoloft, amitriptyline and Lexapro. No H/Omania, psychosis, hallucination, suicidal attemptandinpatient psychiatric treatment.   Psychiatric Specialty Exam: Physical Exam  ROS  Last menstrual period 11/28/2012.There is no height or weight on file to calculate BMI.  General Appearance: NA  Eye Contact:  NA  Speech:  Clear and Coherent and Slow   Volume:  Decreased  Mood:  Dysphoric  Affect:  NA  Thought Process:  Goal Directed  Orientation:  Full (Time, Place, and Person)  Thought Content:  Rumination  Suicidal Thoughts:  No  Homicidal Thoughts:  No  Memory:  Immediate;   Good Recent;   Good Remote;   Good  Judgement:  Good  Insight:  Good  Psychomotor Activity:  NA  Concentration:  Concentration: Good and Attention Span: Good  Recall:  Good  Fund of Knowledge:  Good  Language:  Good  Akathisia:  No  Handed:  Right  AIMS (if indicated):     Assets:  Communication Skills Desire for Improvement Housing Resilience Social Support  ADL's:  Intact  Cognition:  WNL  Sleep:   5-6      Assessment and Plan: Major depressive disorder, recurrent.  Generalized anxiety disorder.  Reassurance given about recent news related to her best friend brain tumor.  Recommended therapy but patient told she has good friends and family member and she can also reach out to her church for therapy.  She does not want to add any medication.  I recommend to try gabapentin 100 mg 4 times a day to help her anxiety which she agreed.  In the past she had tried higher dose which make her dizzy and I reminded that if gabapentin makes her dizzy then she should call us back.  She will continue trazodone 100 mg 250 mg as needed for insomnia, Lamictal 200 mg daily.  Discussed medication side effects and benefits.  Recommended to call us back if is  any question or any concern.  Follow-up in 3 months.  Follow Up Instructions:    I discussed the assessment and treatment plan with the patient. The patient was provided an opportunity to ask questions and all were answered. The patient agreed with the plan and demonstrated an understanding of the instructions.   The patient was advised to call back or seek an in-person evaluation if the symptoms worsen or if the condition fails to improve as anticipated.  I provided 20 minutes of non-face-to-face time during  this encounter.   Kathlee Nations, MD

## 2019-04-26 ENCOUNTER — Ambulatory Visit (HOSPITAL_COMMUNITY): Payer: BLUE CROSS/BLUE SHIELD | Admitting: Psychiatry

## 2019-06-11 ENCOUNTER — Other Ambulatory Visit: Payer: Self-pay

## 2019-06-11 ENCOUNTER — Ambulatory Visit
Admission: RE | Admit: 2019-06-11 | Discharge: 2019-06-11 | Disposition: A | Discharge status: Home / Self Care | Payer: BC Managed Care – PPO | Source: Ambulatory Visit | Attending: Certified Nurse Midwife | Admitting: Certified Nurse Midwife

## 2019-06-11 DIAGNOSIS — Z1231 Encounter for screening mammogram for malignant neoplasm of breast: Secondary | ICD-10-CM

## 2019-06-13 ENCOUNTER — Other Ambulatory Visit: Payer: Self-pay | Admitting: Certified Nurse Midwife

## 2019-06-13 DIAGNOSIS — R928 Other abnormal and inconclusive findings on diagnostic imaging of breast: Secondary | ICD-10-CM

## 2019-06-14 ENCOUNTER — Ambulatory Visit: Payer: BC Managed Care – PPO

## 2019-06-14 ENCOUNTER — Other Ambulatory Visit: Payer: Self-pay

## 2019-06-14 ENCOUNTER — Ambulatory Visit
Admission: RE | Admit: 2019-06-14 | Discharge: 2019-06-14 | Disposition: A | Discharge status: Home / Self Care | Payer: BC Managed Care – PPO | Source: Ambulatory Visit | Attending: Certified Nurse Midwife | Admitting: Certified Nurse Midwife

## 2019-06-14 DIAGNOSIS — R928 Other abnormal and inconclusive findings on diagnostic imaging of breast: Secondary | ICD-10-CM

## 2019-07-16 ENCOUNTER — Other Ambulatory Visit: Payer: Self-pay

## 2019-07-16 ENCOUNTER — Ambulatory Visit (INDEPENDENT_AMBULATORY_CARE_PROVIDER_SITE_OTHER): Payer: BC Managed Care – PPO | Admitting: Psychiatry

## 2019-07-16 ENCOUNTER — Encounter (HOSPITAL_COMMUNITY): Payer: Self-pay | Admitting: Psychiatry

## 2019-07-16 DIAGNOSIS — F411 Generalized anxiety disorder: Secondary | ICD-10-CM | POA: Diagnosis not present

## 2019-07-16 DIAGNOSIS — F330 Major depressive disorder, recurrent, mild: Secondary | ICD-10-CM

## 2019-07-16 MED ORDER — TRAZODONE HCL 100 MG PO TABS: ORAL_TABLET | ORAL | 0 refills | Status: DC

## 2019-07-16 MED ORDER — GABAPENTIN 100 MG PO CAPS: 100.0000 mg | ORAL_CAPSULE | Freq: Four times a day (QID) | ORAL | 0 refills | Status: DC

## 2019-07-16 MED ORDER — LAMOTRIGINE 200 MG PO TABS: 200.0000 mg | ORAL_TABLET | Freq: Every day | ORAL | 0 refills | Status: DC

## 2019-07-16 NOTE — Progress Notes (Signed)
Virtual Visit via Telephone Note  I connected with Harleen Fineberg on 07/16/19 at  9:40 AM EST by telephone and verified that I am speaking with the correct person using two identifiers.   I discussed the limitations, risks, security and privacy concerns of performing an evaluation and management service by telephone and the availability of in person appointments. I also discussed with the patient that there may be a patient responsible charge related to this service. The patient expressed understanding and agreed to proceed.   History of Present Illness: Patient was evaluated by phone session.  She is doing much better since we adjusted the dose of Lamictal and gabapentin.  She is sleeping better.  She is pleased that her friend who diagnosed with brain tumor is now finished radiation and she is having MRI in 2 weeks.  Her husband is still not able to find a job but is unemployment extended and she is relieved.  Overall she feels the medicine is working.  She denies any crying spells or feeling of hopelessness or worthlessness.  She is sleeping much better.  She has no paranoia.  Her appetite is okay.  She still anxious about Covid but she is relieved since news about the vaccine came out.  She has no more dizziness.   Past Psychiatric History:Reviewed. H/Odepression andAnxiety. TriedWellbutrin, Prozac, Zoloft, amitriptyline and Lexapro. No H/Omania, psychosis, hallucination, suicidal attemptandinpatient psychiatric treatment.   Psychiatric Specialty Exam: Physical Exam  ROS  Last menstrual period 11/28/2012.There is no height or weight on file to calculate BMI.  General Appearance: NA  Eye Contact:  NA  Speech:  Clear and Coherent and Normal Rate  Volume:  Normal  Mood:  Euthymic  Affect:  NA  Thought Process:  Goal Directed  Orientation:  Full (Time, Place, and Person)  Thought Content:  Rumination  Suicidal Thoughts:  No  Homicidal Thoughts:  No  Memory:  Immediate;    Good Recent;   Good Remote;   Good  Judgement:  Good  Insight:  Good  Psychomotor Activity:  NA  Concentration:  Concentration: Good and Attention Span: Good  Recall:  Good  Fund of Knowledge:  Good  Language:  Good  Akathisia:  No  Handed:  Right  AIMS (if indicated):     Assets:  Communication Skills Desire for Improvement Housing Resilience  ADL's:  Intact  Cognition:  WNL  Sleep:   ok      Assessment and Plan: Major depressive disorder, recurrent.  Generalized anxiety disorder.  Patient doing better since dose adjusted.  Her anxiety and depression is much better.  She is tolerating her medication without any side effects.  Continue Lamictal 200 mg daily, gabapentin 100 mg 4 times a day and trazodone 100-150 mg as needed for insomnia.  Recommended to call us back if she has any question of any concern.  Follow-up in 3 months.  Follow Up Instructions:    I discussed the assessment and treatment plan with the patient. The patient was provided an opportunity to ask questions and all were answered. The patient agreed with the plan and demonstrated an understanding of the instructions.   The patient was advised to call back or seek an in-person evaluation if the symptoms worsen or if the condition fails to improve as anticipated.  I provided 20 minutes of non-face-to-face time during this encounter.   Kathlee Nations, MD

## 2019-10-15 ENCOUNTER — Other Ambulatory Visit: Payer: Self-pay

## 2019-10-15 ENCOUNTER — Ambulatory Visit (INDEPENDENT_AMBULATORY_CARE_PROVIDER_SITE_OTHER): Payer: 59 | Admitting: Psychiatry

## 2019-10-15 ENCOUNTER — Encounter (HOSPITAL_COMMUNITY): Payer: Self-pay | Admitting: Psychiatry

## 2019-10-15 DIAGNOSIS — F330 Major depressive disorder, recurrent, mild: Secondary | ICD-10-CM

## 2019-10-15 DIAGNOSIS — F411 Generalized anxiety disorder: Secondary | ICD-10-CM

## 2019-10-15 MED ORDER — LAMOTRIGINE 200 MG PO TABS: 200.0000 mg | ORAL_TABLET | Freq: Every day | ORAL | 0 refills | Status: DC

## 2019-10-15 MED ORDER — GABAPENTIN 100 MG PO CAPS: 100.0000 mg | ORAL_CAPSULE | Freq: Four times a day (QID) | ORAL | 0 refills | Status: DC

## 2019-10-15 MED ORDER — TRAZODONE HCL 100 MG PO TABS: ORAL_TABLET | ORAL | 0 refills | Status: DC

## 2019-10-15 NOTE — Progress Notes (Signed)
Virtual Visit via Telephone Note  I connected with Jaime [name] on 10/15/19 at  8:40 AM EST by telephone and verified that I am speaking with the correct person using two identifiers.   I discussed the limitations, risks, security and privacy concerns of performing an evaluation and management service by telephone and the availability of in person appointments. I also discussed with the patient that there may be a patient responsible charge related to this service. The patient expressed understanding and agreed to proceed.   History of Present Illness: Patient was evaluated by phone session.  She admitted anxious for past 2 days since there is no power since Saturday and not sure if it will come tonight.  However she is taking her medication and she feels things are going well.  She do get sometimes anxious about pandemic but she is especially very strong and hopeful about the future.  Her best friend who was diagnosed with brain tumor had an MRI after the treatment and she is hoping she does not need any more radiation.  Her husband has a job interview and she is very hopeful that in March he will start working.  Overall she feels things are going well.  She is taking gabapentin and feels it is helping her mood and anxiety.  She denies crying spells or any suicidal thoughts.  She has no more dizziness, rash, itching.  She does not want to change medication because she feels it is helping her mood sleep and anxiety.  Her appetite is okay but she had lost 3 pounds on the last visit.  There were no new medication added.  Hydrogen level is good.  She lives with her husband who is very supportive.  Past Psychiatric History:Reviewed. H/Odepression andanxiety. TriedWellbutrin, Prozac, Zoloft, amitriptyline and Lexapro. No h/o mania, psychosis, hallucination, suicidal attemptor inpatient.    Psychiatric Specialty Exam: Physical Exam  Review of Systems  Last menstrual period 11/28/2012.There  is no height or weight on file to calculate BMI.  General Appearance: NA  Eye Contact:  NA  Speech:  Clear and Coherent  Volume:  Normal  Mood:  Anxious  Affect:  NA  Thought Process:  Goal Directed  Orientation:  Full (Time, Place, and Person)  Thought Content:  Logical  Suicidal Thoughts:  No  Homicidal Thoughts:  No  Memory:  Immediate;   Good Recent;   Good Remote;   Good  Judgement:  Intact  Insight:  Present  Psychomotor Activity:  NA  Concentration:  Concentration: Good and Attention Span: Good  Recall:  Good  Fund of Knowledge:  Good  Language:  Good  Akathisia:  No  Handed:  Right  AIMS (if indicated):     Assets:  Communication Skills Desire for Improvement Housing Resilience  ADL's:  Intact  Cognition:  WNL  Sleep:   ok      Assessment and Plan: Major depressive disorder, recurrent.  Generalized anxiety disorder.  Patient is a stable on her current medication.  She feels the current dose of medicine working.  She has no rash or any shakes.  Continue Lamictal 200 mg daily, gabapentin 100 mg 4 times a day and trazodone 100-150 mg as needed for insomnia.  Recommend to call us back if she has any question or any concern.  Follow-up in 3 months.  Follow Up Instructions:    I discussed the assessment and treatment plan with the patient. The patient was provided an opportunity to ask questions and all were  answered. The patient agreed with the plan and demonstrated an understanding of the instructions.   The patient was advised to call back or seek an in-person evaluation if the symptoms worsen or if the condition fails to improve as anticipated.  I provided 15 minutes of non-face-to-face time during this encounter.   Kathlee Nations, MD

## 2019-11-21 ENCOUNTER — Encounter: Payer: Self-pay | Admitting: Certified Nurse Midwife

## 2019-11-24 ENCOUNTER — Ambulatory Visit: Payer: Self-pay | Attending: Internal Medicine

## 2019-11-24 DIAGNOSIS — Z23 Encounter for immunization: Secondary | ICD-10-CM

## 2019-11-24 NOTE — Progress Notes (Signed)
   Covid-19 Vaccination Clinic  Name:  Jaime [name]    MRN: [id number] DOB: 19-Dec-1965  11/24/2019  Jaime [name] was observed post Covid-19 immunization for 15 minutes without incident. She was provided with Vaccine Information Sheet and instruction to access the V-Safe system.   Jaime [name] was instructed to call 911 with any severe reactions post vaccine: Marland Kitchen Difficulty breathing  . Swelling of face and throat  . A fast heartbeat  . A bad rash all over body  . Dizziness and weakness   Immunizations Administered    Name Date Dose VIS Date Route   Pfizer COVID-19 Vaccine 11/24/2019 10:35 AM 0.3 mL 08/10/2019 Intramuscular   Manufacturer: [employer], [employer]   Lot: [id number]   NDC: [address]-1

## 2019-11-29 ENCOUNTER — Telehealth: Payer: Self-pay | Admitting: Certified Nurse Midwife

## 2019-11-29 NOTE — Telephone Encounter (Signed)
Patient will call back to reschedule aex. °

## 2019-12-18 ENCOUNTER — Ambulatory Visit: Payer: Self-pay | Attending: Internal Medicine

## 2019-12-18 DIAGNOSIS — Z23 Encounter for immunization: Secondary | ICD-10-CM

## 2019-12-18 NOTE — Progress Notes (Signed)
   Covid-19 Vaccination Clinic  Name:  Jaime [name]    MRN: [id number] DOB: 1966/05/29  12/18/2019  [name] was observed post Covid-19 immunization for 15 minutes without incident. She was provided with Vaccine Information Sheet and instruction to access the V-Safe system.   [name] was instructed to call 911 with any severe reactions post vaccine: Marland Kitchen Difficulty breathing  . Swelling of face and throat  . A fast heartbeat  . A bad rash all over body  . Dizziness and weakness   Immunizations Administered    Name Date Dose VIS Date Route   Pfizer COVID-19 Vaccine 12/18/2019 10:22 AM 0.3 mL 10/24/2018 Intramuscular   Manufacturer: [employer], [employer]   Lot: [id number]   NDC: [address]-2

## 2020-01-09 ENCOUNTER — Other Ambulatory Visit: Payer: Self-pay

## 2020-01-09 ENCOUNTER — Encounter (HOSPITAL_COMMUNITY): Payer: Self-pay | Admitting: Psychiatry

## 2020-01-09 ENCOUNTER — Telehealth (INDEPENDENT_AMBULATORY_CARE_PROVIDER_SITE_OTHER): Payer: 59 | Admitting: Psychiatry

## 2020-01-09 DIAGNOSIS — F330 Major depressive disorder, recurrent, mild: Secondary | ICD-10-CM | POA: Diagnosis not present

## 2020-01-09 DIAGNOSIS — F411 Generalized anxiety disorder: Secondary | ICD-10-CM

## 2020-01-09 MED ORDER — TRAZODONE HCL 100 MG PO TABS: ORAL_TABLET | ORAL | 0 refills | Status: DC

## 2020-01-09 MED ORDER — GABAPENTIN 100 MG PO CAPS: 100.0000 mg | ORAL_CAPSULE | Freq: Four times a day (QID) | ORAL | 0 refills | Status: DC

## 2020-01-09 MED ORDER — LAMOTRIGINE 200 MG PO TABS: 200.0000 mg | ORAL_TABLET | Freq: Every day | ORAL | 0 refills | Status: DC

## 2020-01-09 NOTE — Progress Notes (Signed)
Virtual Visit via Telephone Note  I connected with Jaime [name] on 01/09/20 at  8:40 AM EDT by telephone and verified that I am speaking with the correct person using two identifiers.   I discussed the limitations, risks, security and privacy concerns of performing an evaluation and management service by telephone and the availability of in person appointments. I also discussed with the patient that there may be a patient responsible charge related to this service. The patient expressed understanding and agreed to proceed.   History of Present Illness: Patient is evaluated by phone session.  She is taking her medication as prescribed.  She tends to get anxious and emotional but overall she described her mood is stable.  She is sleeping okay.  She denies any crying spells or any feeling of hopelessness or worthlessness.  She is pleased that her husband is working but he gets tired when he comes back and lie down on the couch.  Her best friend who was diagnosed with brain tumor is sick and lately not going to work.  She is concerned about her.  She denies any lately crying spells, suicidal thoughts, anger or severe irritability.  She feels her anxiety is under control and she wants to keep the current medication.  She sleeps good.  Trazodone.  Her energy level is good.  She lives with her husband who is supportive.  She has no rash, tremors, shakes or any EPS.   Past Psychiatric History:Reviewed. H/Odepression andanxiety. TriedWellbutrin, Prozac, Zoloft, amitriptyline and Lexapro. No h/o mania, psychosis, hallucination, suicidal attemptor inpatient.   Psychiatric Specialty Exam: Physical Exam  Review of Systems  Last menstrual period 11/28/2012.There is no height or weight on file to calculate BMI.  General Appearance: NA  Eye Contact:  NA  Speech:  Normal Rate  Volume:  Normal  Mood:  Anxious and emotional  Affect:  NA  Thought Process:  Goal Directed  Orientation:  Full  (Time, Place, and Person)  Thought Content:  Rumination  Suicidal Thoughts:  No  Homicidal Thoughts:  No  Memory:  Immediate;   Good Recent;   Good Remote;   Good  Judgement:  Intact  Insight:  Present  Psychomotor Activity:  NA  Concentration:  Concentration: Good and Attention Span: Good  Recall:  Good  Fund of Knowledge:  Good  Language:  Good  Akathisia:  No  Handed:  Right  AIMS (if indicated):     Assets:  Communication Skills Desire for Improvement Housing Resilience Social Support  ADL's:  Intact  Cognition:  WNL  Sleep:   ok      Assessment and Plan: Major depressive disorder, recurrent.  Generalized anxiety disorder.  Patient is a stable on her current medication.  She has no rash, itching tremors or shakes.  Continue Lamictal 200 mg daily, gabapentin 100 mg 4 times a day and trazodone 100-150 mg at bedtime for insomnia.  Recommended to call us back if she has any questions or any concerns.  Follow-up in 3 months.  Follow Up Instructions:    I discussed the assessment and treatment plan with the patient. The patient was provided an opportunity to ask questions and all were answered. The patient agreed with the plan and demonstrated an understanding of the instructions.   The patient was advised to call back or seek an in-person evaluation if the symptoms worsen or if the condition fails to improve as anticipated.  I provided 15 minutes of non-face-to-face time during this encounter.  Kathlee Nations, MD

## 2020-01-10 ENCOUNTER — Ambulatory Visit (HOSPITAL_COMMUNITY): Payer: Self-pay | Admitting: Psychiatry

## 2020-03-03 ENCOUNTER — Ambulatory Visit: Payer: BC Managed Care – PPO | Admitting: Certified Nurse Midwife

## 2020-03-04 ENCOUNTER — Ambulatory Visit: Payer: BC Managed Care – PPO | Admitting: Certified Nurse Midwife

## 2020-04-09 ENCOUNTER — Telehealth (HOSPITAL_COMMUNITY): Payer: 59 | Admitting: Psychiatry

## 2020-04-10 ENCOUNTER — Telehealth (INDEPENDENT_AMBULATORY_CARE_PROVIDER_SITE_OTHER): Payer: BC Managed Care – PPO | Admitting: Psychiatry

## 2020-04-10 ENCOUNTER — Encounter (HOSPITAL_COMMUNITY): Payer: Self-pay | Admitting: Psychiatry

## 2020-04-10 ENCOUNTER — Other Ambulatory Visit: Payer: Self-pay

## 2020-04-10 DIAGNOSIS — F411 Generalized anxiety disorder: Secondary | ICD-10-CM | POA: Diagnosis not present

## 2020-04-10 DIAGNOSIS — F330 Major depressive disorder, recurrent, mild: Secondary | ICD-10-CM

## 2020-04-10 MED ORDER — LAMOTRIGINE 200 MG PO TABS: 200.0000 mg | ORAL_TABLET | Freq: Every day | ORAL | 0 refills | Status: DC

## 2020-04-10 MED ORDER — TRAZODONE HCL 100 MG PO TABS: ORAL_TABLET | ORAL | 0 refills | Status: DC

## 2020-04-10 MED ORDER — GABAPENTIN 100 MG PO CAPS: 100.0000 mg | ORAL_CAPSULE | Freq: Four times a day (QID) | ORAL | 0 refills | Status: DC

## 2020-04-10 NOTE — Progress Notes (Signed)
Virtual Visit via Telephone Note  I connected with Jaime [name] on 04/10/20 at  8:40 AM EDT by telephone and verified that I am speaking with the correct person using two identifiers.  Location: Patient: home Provider: home office   I discussed the limitations, risks, security and privacy concerns of performing an evaluation and management service by telephone and the availability of in person appointments. I also discussed with the patient that there may be a patient responsible charge related to this service. The patient expressed understanding and agreed to proceed.   History of Present Illness: Patient is evaluated by phone session.  She is doing well on her current medication.  She is pleased that her best friend who diagnosed with brain tumor and further treatment the tumor reduced 40%.  She is hoping that her friend recover completely.  Patient is sleeping good but sometimes on Fridays and Saturdays she need to take a higher dose of trazodone because these days are longer days for her.  She continues to work and she has been busy working.  Her husband is also working many hours even on Saturday.  But overall she feels things are going well.  She denies any crying spells or any feeling of hopelessness.  She denies any panic attack and does not worried about things.  Her appetite is okay.  Her energy level is good.  She denies any suicidal thoughts and like to keep her current medication.      Past Psychiatric History:Reviewed. H/Odepression andanxiety. TriedWellbutrin, Prozac, Zoloft, amitriptyline and Lexapro. Noh/omania, psychosis, hallucination, suicidal attemptorinpatient.   Psychiatric Specialty Exam: Physical Exam  Review of Systems  Weight 147 lb (66.7 kg), last menstrual period 11/28/2012.There is no height or weight on file to calculate BMI.  General Appearance: NA  Eye Contact:  NA  Speech:  Clear and Coherent  Volume:  Normal  Mood:  Euthymic   Affect:  NA  Thought Process:  Goal Directed  Orientation:  Full (Time, Place, and Person)  Thought Content:  WDL  Suicidal Thoughts:  No  Homicidal Thoughts:  No  Memory:  Immediate;   Good Recent;   Good Remote;   Good  Judgement:  Good  Insight:  Good  Psychomotor Activity:  NA  Concentration:  Concentration: Good and Attention Span: Good  Recall:  Good  Fund of Knowledge:  Good  Language:  Good  Akathisia:  No  Handed:  Right  AIMS (if indicated):     Assets:  Communication Skills Desire for Improvement Housing Resilience Social Support Transportation  ADL's:  Intact  Cognition:  WNL  Sleep:         Assessment and Plan: Major depressive disorder, recurrent.  Generalized anxiety disorder.  Patient is doing good on her current medication.  She like to continue current medication.  She has no rash or any itching.  Continue Lamictal 200 mg daily, gabapentin 100 mg 4 times a day and trazodone 100-150 mg at bedtime for insomnia.  Recommended to call us back if she has any question or any concern.  Follow-up in 3 months.  Follow Up Instructions:    I discussed the assessment and treatment plan with the patient. The patient was provided an opportunity to ask questions and all were answered. The patient agreed with the plan and demonstrated an understanding of the instructions.   The patient was advised to call back or seek an in-person evaluation if the symptoms worsen or if the condition fails to improve as  anticipated.  I provided 16 minutes of non-face-to-face time during this encounter.   Kathlee Nations, MD

## 2020-07-07 ENCOUNTER — Telehealth (HOSPITAL_COMMUNITY): Payer: Self-pay | Admitting: Psychiatry

## 2020-07-11 ENCOUNTER — Telehealth (INDEPENDENT_AMBULATORY_CARE_PROVIDER_SITE_OTHER): Payer: BC Managed Care – PPO | Admitting: Psychiatry

## 2020-07-11 ENCOUNTER — Other Ambulatory Visit: Payer: Self-pay

## 2020-07-11 ENCOUNTER — Encounter (HOSPITAL_COMMUNITY): Payer: Self-pay | Admitting: Psychiatry

## 2020-07-11 VITALS — Wt 147.0 lb

## 2020-07-11 DIAGNOSIS — F411 Generalized anxiety disorder: Secondary | ICD-10-CM | POA: Diagnosis not present

## 2020-07-11 DIAGNOSIS — F330 Major depressive disorder, recurrent, mild: Secondary | ICD-10-CM

## 2020-07-11 DIAGNOSIS — F4321 Adjustment disorder with depressed mood: Secondary | ICD-10-CM

## 2020-07-11 MED ORDER — GABAPENTIN 100 MG PO CAPS: 100.0000 mg | ORAL_CAPSULE | Freq: Four times a day (QID) | ORAL | 0 refills | Status: DC

## 2020-07-11 MED ORDER — LAMOTRIGINE 200 MG PO TABS: 200.0000 mg | ORAL_TABLET | Freq: Every day | ORAL | 0 refills | Status: DC

## 2020-07-11 MED ORDER — TRAZODONE HCL 100 MG PO TABS: ORAL_TABLET | ORAL | 0 refills | Status: DC

## 2020-07-11 NOTE — Progress Notes (Signed)
Virtual Visit via Telephone Note  I connected with Jaime [name] on 07/11/20 at  8:40 AM EST by telephone and verified that I am speaking with the correct person using two identifiers.  Location: Patient: Home Provider: Home Office   I discussed the limitations, risks, security and privacy concerns of performing an evaluation and management service by telephone and the availability of in person appointments. I also discussed with the patient that there may be a patient responsible charge related to this service. The patient expressed understanding and agreed to proceed.   History of Present Illness: Patient is evaluated by phone session.  She is very sad because her best friend who was diagnosed with brain tumor died recently.  Her funeral was last week.  Patient told it is been very hard and difficult to deal with the loss.  She admitted having nervousness, feeling overwhelmed, lack of motivation, racing thoughts and some nights poor sleep.  She is trying to keep herself busy.  She told her husband is very supportive in this time.  She reported crying spells and emptiness but denies any suicidal thoughts, feeling of hopelessness or worthlessness.  She denies any panic attack.  She reported her weight is stable but her appetite is low.  Her energy level is low.  She denies any anger, mania or psychosis.  She is thinking to get grief counseling through the church and she will talk to them tomorrow.  She is taking the medication and reported no side effects.  She takes gabapentin 100 mg 4 times a day, trazodone 100 mg at bedtime and lamotrigine 200 mg daily.  She has no rash, itching tremors or shakes.   Past Psychiatric History:Reviewed. H/Odepression andanxiety. TriedWellbutrin, Prozac, Zoloft, amitriptyline and Lexapro. Noh/omania, psychosis, hallucination, suicidal attemptorinpatient.  Psychiatric Specialty Exam: Physical Exam  Review of Systems  Weight 147 lb (66.7 kg),  last menstrual period 11/28/2012.There is no height or weight on file to calculate BMI.  General Appearance: NA  Eye Contact:  NA  Speech:  Clear and Coherent  Volume:  Normal  Mood:  Depressed  Affect:  NA  Thought Process:  Goal Directed  Orientation:  Full (Time, Place, and Person)  Thought Content:  WDL  Suicidal Thoughts:  No  Homicidal Thoughts:  No  Memory:  Immediate;   Good Recent;   Good Remote;   Good  Judgement:  Intact  Insight:  Present  Psychomotor Activity:  NA  Concentration:  Concentration: Good and Attention Span: Good  Recall:  Good  Fund of Knowledge:  Good  Language:  Good  Akathisia:  No  Handed:  Right  AIMS (if indicated):     Assets:  Communication Skills Desire for Improvement Housing Transportation  ADL's:  Intact  Cognition:  WNL  Sleep:   fair      Assessment and Plan: Major depressive disorder, recurrent.  Anxiety.  Grief  Discussed recent loss of her best friend who died due to brain tumor.  Encourage that she should start grief counseling through the church.  I also recommend that she can try taking trazodone 150 mg for a few days to help her insomnia.  She had tried in the past but it make her groggy next day.  The patient is willing to try again since she wants her sleep to get under control.  I also gave option to her increase gabapentin 100 mg 5 times a day but patient is more comfortable to increase the trazodone and like to  keep the gabapentin 100 mg 4 times a day.  I will also continue Lamictal 200 mg daily.  Recommend to call us back if she is any question, concern or if she feels worsening of the symptom.  Follow-up in 3 months.  She can call us earlier if needed for early appointment.  Follow Up Instructions:    I discussed the assessment and treatment plan with the patient. The patient was provided an opportunity to ask questions and all were answered. The patient agreed with the plan and demonstrated an understanding of the  instructions.   The patient was advised to call back or seek an in-person evaluation if the symptoms worsen or if the condition fails to improve as anticipated.  I provided 16 minutes of non-face-to-face time during this encounter.   Cleotis Nipper, MD

## 2020-07-28 ENCOUNTER — Other Ambulatory Visit: Payer: Self-pay | Admitting: Obstetrics and Gynecology

## 2020-07-28 DIAGNOSIS — Z1231 Encounter for screening mammogram for malignant neoplasm of breast: Secondary | ICD-10-CM

## 2020-09-08 ENCOUNTER — Other Ambulatory Visit: Payer: Self-pay

## 2020-09-08 ENCOUNTER — Ambulatory Visit
Admission: RE | Admit: 2020-09-08 | Discharge: 2020-09-08 | Disposition: A | Discharge status: Home / Self Care | Payer: BC Managed Care – PPO | Source: Ambulatory Visit | Attending: Obstetrics and Gynecology | Admitting: Obstetrics and Gynecology

## 2020-09-08 DIAGNOSIS — Z1231 Encounter for screening mammogram for malignant neoplasm of breast: Secondary | ICD-10-CM

## 2020-10-09 ENCOUNTER — Encounter (HOSPITAL_COMMUNITY): Payer: Self-pay | Admitting: Psychiatry

## 2020-10-09 ENCOUNTER — Telehealth (INDEPENDENT_AMBULATORY_CARE_PROVIDER_SITE_OTHER): Payer: BC Managed Care – PPO | Admitting: Psychiatry

## 2020-10-09 ENCOUNTER — Other Ambulatory Visit: Payer: Self-pay

## 2020-10-09 VITALS — Wt 143.0 lb

## 2020-10-09 DIAGNOSIS — F419 Anxiety disorder, unspecified: Secondary | ICD-10-CM | POA: Diagnosis not present

## 2020-10-09 DIAGNOSIS — F330 Major depressive disorder, recurrent, mild: Secondary | ICD-10-CM | POA: Diagnosis not present

## 2020-10-09 MED ORDER — TRAZODONE HCL 100 MG PO TABS: ORAL_TABLET | ORAL | 0 refills | Status: DC

## 2020-10-09 MED ORDER — GABAPENTIN 100 MG PO CAPS: 100.0000 mg | ORAL_CAPSULE | Freq: Four times a day (QID) | ORAL | 0 refills | Status: DC

## 2020-10-09 MED ORDER — LAMOTRIGINE 200 MG PO TABS: 200.0000 mg | ORAL_TABLET | Freq: Every day | ORAL | 0 refills | Status: DC

## 2020-10-09 NOTE — Progress Notes (Signed)
Virtual Visit via Telephone Note  I connected with Jaime [name] on 10/09/20 at  8:40 AM EST by telephone and verified that I am speaking with the correct person using two identifiers.  Location: Patient: Home Provider: Home Office   I discussed the limitations, risks, security and privacy concerns of performing an evaluation and management service by telephone and the availability of in person appointments. I also discussed with the patient that there may be a patient responsible charge related to this service. The patient expressed understanding and agreed to proceed.   History of Present Illness: Patient is evaluated by phone session.  She has been doing well and had a good holidays.  On the last visit we recommended to take gabapentin 4 times a day and if needed to take trazodone 150 mg.  She tried trazodone 150 mg.  Make her very groggy and she is back on 100 mg every night.  Overall she feels her anxiety is not as bad.  She is sleeping good.  Her energy level is good.  She denies any recent crying spells or any feeling of hopelessness or worthlessness.  She still misses her best friend who died brain tumor few months ago.  She admitted holidays were difficult but she is happy that she moved down.  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  She is involved in church activities.  She does not want to change the medication since it is working.  She denies any suicidal thoughts.  She denies any panic attack.  Her appetite is okay and her weight is stable.  She lives with her husband who is supportive.   Past Psychiatric History:Reviewed. H/Odepression andanxiety. TriedWellbutrin, Prozac, Zoloft, amitriptyline and Lexapro. Noh/omania, psychosis, hallucination, suicidal attemptorinpatient.  Psychiatric Specialty Exam: Physical Exam  Review of Systems  Weight 143 lb (64.9 kg), last menstrual period 11/28/2012.There is no height or weight on  file to calculate BMI.  General Appearance: NA  Eye Contact:  NA  Speech:  Clear and Coherent  Volume:  Normal  Mood:  Euthymic  Affect:  NA  Thought Process:  Goal Directed  Orientation:  Full (Time, Place, and Person)  Thought Content:  Logical  Suicidal Thoughts:  No  Homicidal Thoughts:  No  Memory:  Immediate;   Good Recent;   Good Remote;   Good  Judgement:  Good  Insight:  Good  Psychomotor Activity:  NA  Concentration:  Concentration: Good and Attention Span: Good  Recall:  Good  Fund of Knowledge:  Good  Language:  Good  Akathisia:  No  Handed:  Right  AIMS (if indicated):     Assets:  Communication Skills Desire for Improvement Housing Resilience Social Support  ADL's:  Intact  Cognition:  WNL  Sleep:   ok      Assessment and Plan: Major depressive disorder, recurrent.  Anxiety.  Patient doing better on current medication.  She has no side effects.  Recommended to continue gabapentin 100 mg 4 times a day, trazodone 100 mg at bedtime and Lamictal 200 mg daily.  She has no rash or any itching.  Recommended to call us back if she has any question or any concern.  Follow-up in 3 months.  Follow Up Instructions:    I discussed the assessment and treatment plan with the patient. The patient was provided an opportunity to ask questions and all were answered. The patient agreed with the plan and demonstrated an understanding of  the instructions.   The patient was advised to call back or seek an in-person evaluation if the symptoms worsen or if the condition fails to improve as anticipated.  I provided 18 minutes of non-face-to-face time during this encounter.   Cleotis Nipper, MD

## 2020-10-27 ENCOUNTER — Ambulatory Visit
Admission: RE | Admit: 2020-10-27 | Discharge: 2020-10-27 | Disposition: A | Discharge status: Home / Self Care | Payer: BC Managed Care – PPO | Source: Ambulatory Visit | Attending: Obstetrics and Gynecology | Admitting: Obstetrics and Gynecology

## 2020-10-27 ENCOUNTER — Other Ambulatory Visit: Payer: Self-pay

## 2020-10-27 IMAGING — MG DIGITAL SCREENING BILAT W/ TOMO W/ CAD
8 series · 9 of 24 positions shown · non-contrast
Comparison: Previous exam(s).

CLINICAL DATA: Screening.

EXAM:
DIGITAL SCREENING BILATERAL MAMMOGRAM WITH TOMO AND CAD

[L MLO synth-2D]
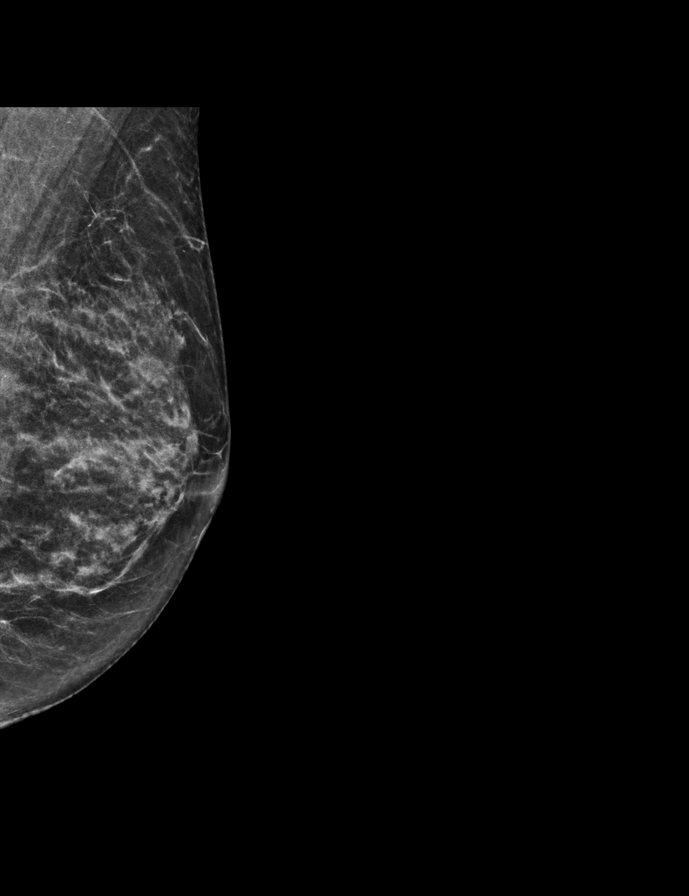

[L CC synth-2D]
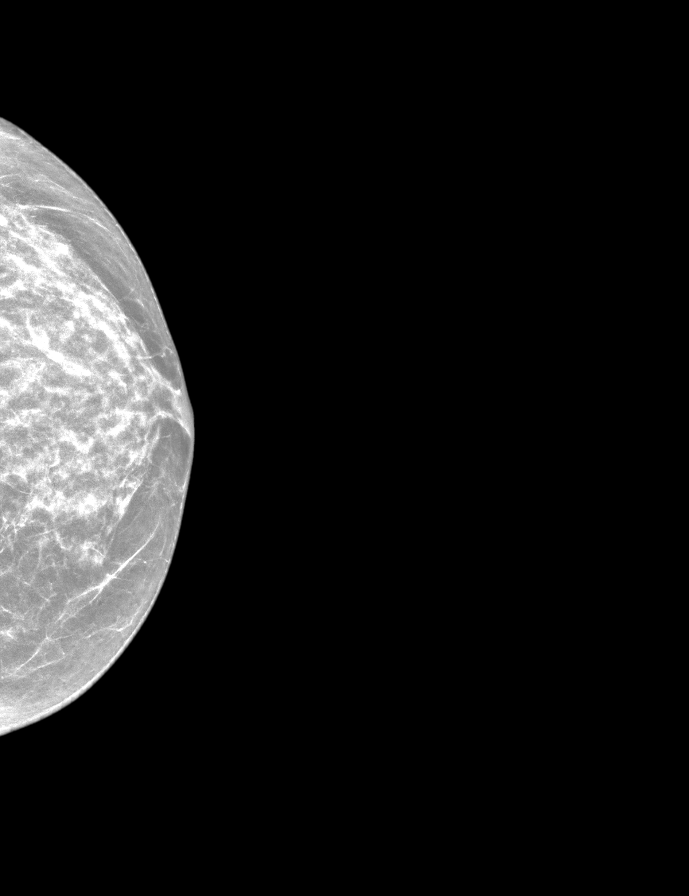

[R CC synth-2D]
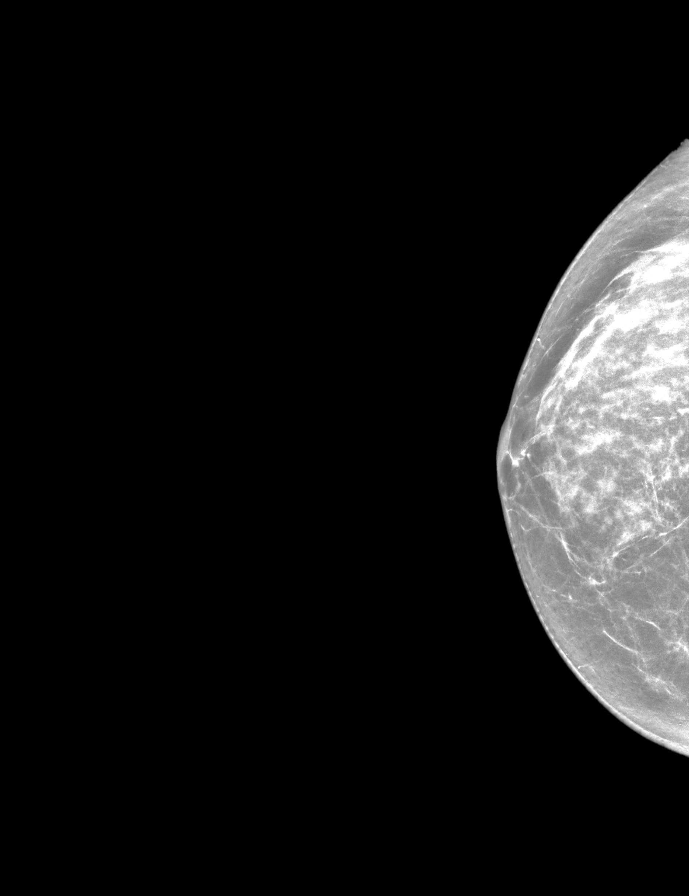

[R MLO synth-2D]
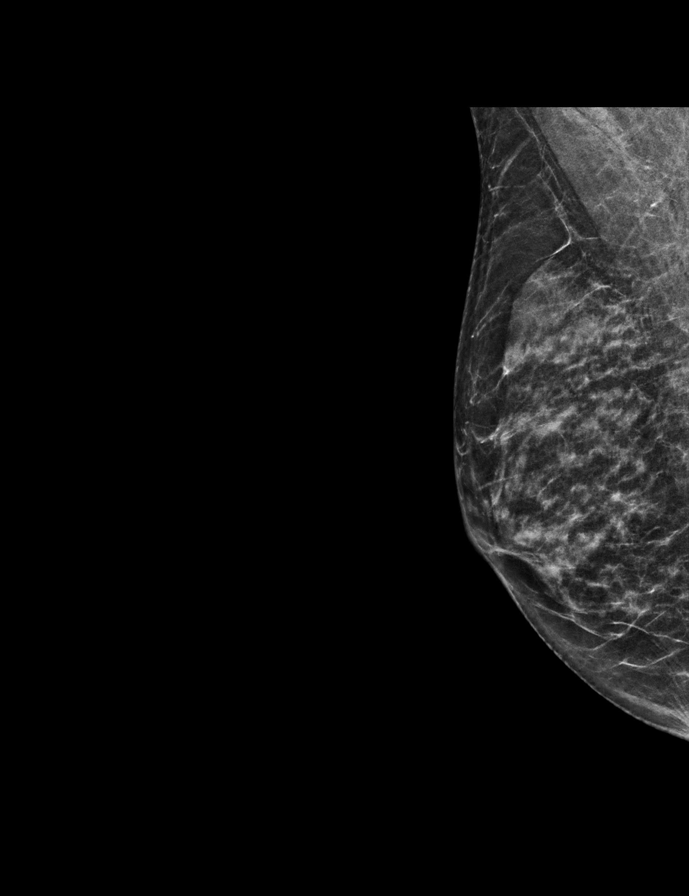

[R MLO tomo · 2 of 50 frames shown]
[frame 17/50]
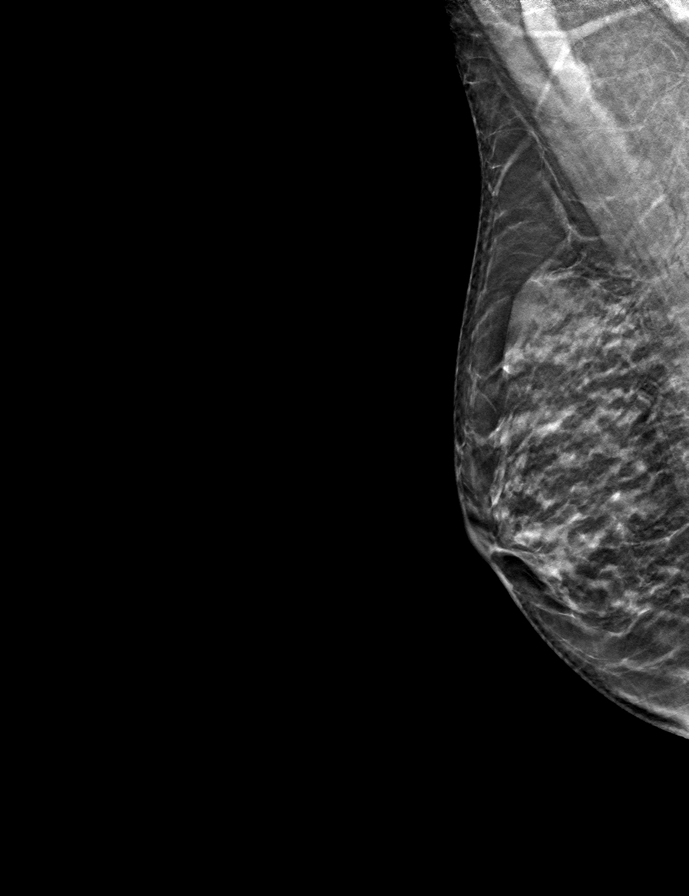
[frame 25/50]
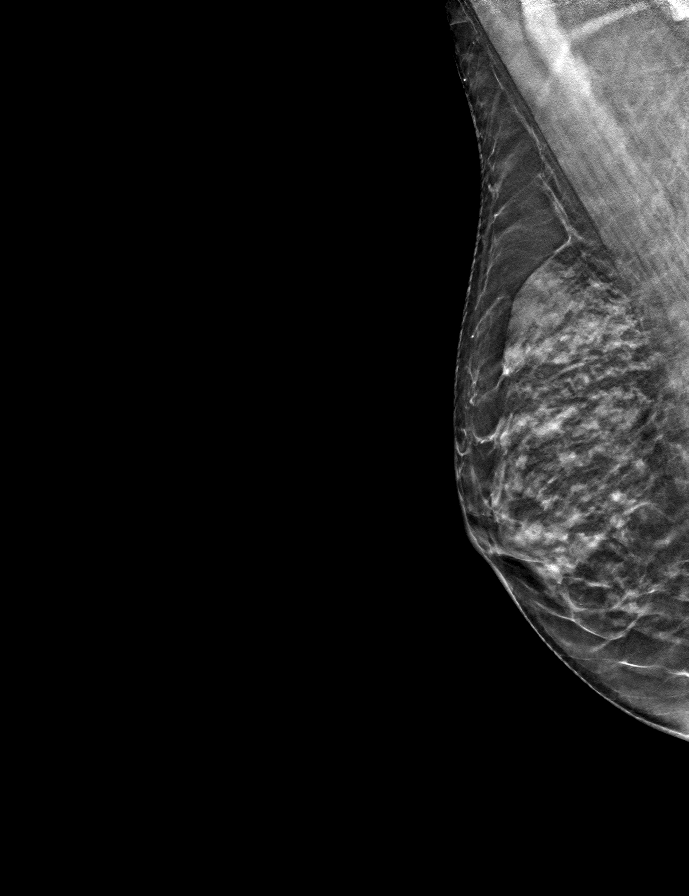

[L CC tomo · tomo slice 25/50.0]
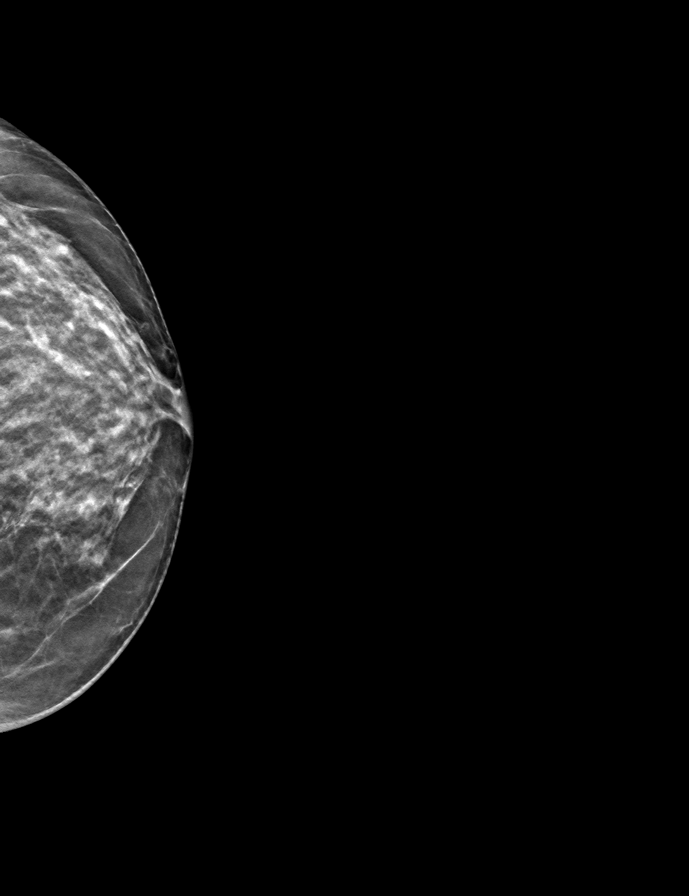

[R CC tomo · tomo slice 28/55.0]
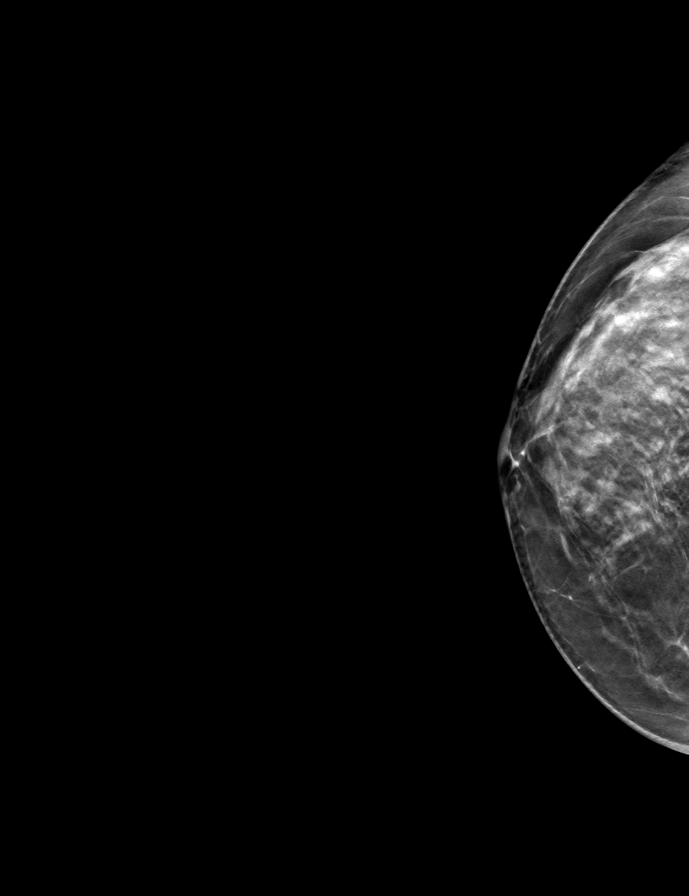

[L MLO tomo · tomo slice 26/51.0]
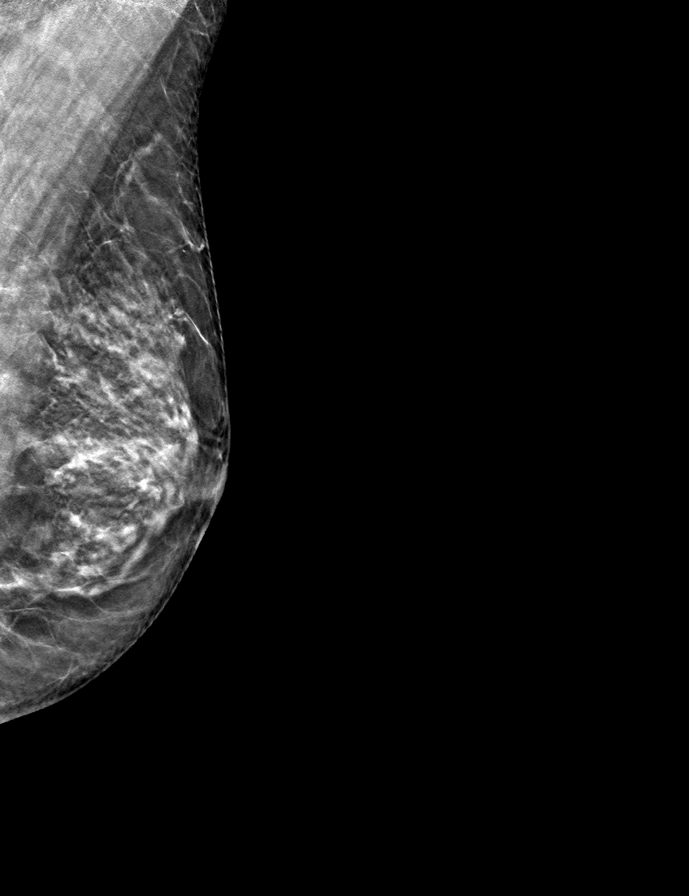

[9 of 24 positions shown; findings below may reference images not displayed]

ACR Breast Density Category c: The breast tissue is heterogeneously
dense, which may obscure small masses.
FINDINGS: In the right breast, possible distortion warrants further
evaluation. In the left breast, no findings suspicious for
malignancy. Images were processed with CAD.
IMPRESSION: Further evaluation is suggested for possible distortion in the right
breast.

RECOMMENDATION:
Diagnostic mammogram and possibly ultrasound of the right breast.
(Code:[id number])

The patient will be contacted regarding the findings, and additional
imaging will be scheduled.

BI-RADS CATEGORY  0: Incomplete. Need additional imaging evaluation
and/or prior mammograms for comparison.

## 2020-10-29 ENCOUNTER — Other Ambulatory Visit: Payer: Self-pay | Admitting: Obstetrics and Gynecology

## 2020-10-29 DIAGNOSIS — R928 Other abnormal and inconclusive findings on diagnostic imaging of breast: Secondary | ICD-10-CM

## 2020-11-12 ENCOUNTER — Ambulatory Visit
Admission: RE | Admit: 2020-11-12 | Discharge: 2020-11-12 | Disposition: A | Discharge status: Home / Self Care | Payer: BC Managed Care – PPO | Source: Ambulatory Visit | Attending: Obstetrics and Gynecology | Admitting: Obstetrics and Gynecology

## 2020-11-12 ENCOUNTER — Other Ambulatory Visit: Payer: Self-pay

## 2020-11-12 DIAGNOSIS — R928 Other abnormal and inconclusive findings on diagnostic imaging of breast: Secondary | ICD-10-CM

## 2021-01-06 ENCOUNTER — Telehealth (INDEPENDENT_AMBULATORY_CARE_PROVIDER_SITE_OTHER): Payer: BC Managed Care – PPO | Admitting: Psychiatry

## 2021-01-06 ENCOUNTER — Encounter (HOSPITAL_COMMUNITY): Payer: Self-pay | Admitting: Psychiatry

## 2021-01-06 ENCOUNTER — Other Ambulatory Visit: Payer: Self-pay

## 2021-01-06 VITALS — Wt 143.0 lb

## 2021-01-06 DIAGNOSIS — F419 Anxiety disorder, unspecified: Secondary | ICD-10-CM

## 2021-01-06 DIAGNOSIS — F330 Major depressive disorder, recurrent, mild: Secondary | ICD-10-CM | POA: Diagnosis not present

## 2021-01-06 DIAGNOSIS — F4321 Adjustment disorder with depressed mood: Secondary | ICD-10-CM | POA: Diagnosis not present

## 2021-01-06 MED ORDER — TRAZODONE HCL 100 MG PO TABS: ORAL_TABLET | ORAL | 0 refills | Status: DC

## 2021-01-06 MED ORDER — LAMOTRIGINE 200 MG PO TABS: 200.0000 mg | ORAL_TABLET | Freq: Every day | ORAL | 0 refills | Status: DC

## 2021-01-06 MED ORDER — GABAPENTIN 100 MG PO CAPS: 100.0000 mg | ORAL_CAPSULE | Freq: Four times a day (QID) | ORAL | 0 refills | Status: DC

## 2021-01-06 NOTE — Progress Notes (Signed)
Virtual Visit via Telephone Note  I connected with Jaime [name] on 01/06/21 at  8:40 AM EDT by telephone and verified that I am speaking with the correct person using two identifiers.  Location: Patient: Home Provider: Home Office   I discussed the limitations, risks, security and privacy concerns of performing an evaluation and management service by telephone and the availability of in person appointments. I also discussed with the patient that there may be a patient responsible charge related to this service. The patient expressed understanding and agreed to proceed.   History of Present Illness: Patient is evaluated by phone session.  She was doing okay until recently one of her friends from church died due to cancer.  Patient told last week had a funeral to attend and that reminded her best friend's death who died 3 months ago from the brain cancer.  She reported crying spells.  She feels going through grief as reminded her best friend death few months ago.  Today her car was also not working and she is taking a day off.  Patient tried to involved in the church activities but her church is 45 minutes away and she does not have as much time and she feels sometimes regret.  She is looking for a new church closer to house.  She lives with her husband who is very supportive.  Patient denies any suicidal thoughts or any feeling of hopelessness.  She admitted going through grief and understand at times in the past.  Today her plan is to visit her mother who lives 20 minutes away to spend time with her.  She is sleeping okay.  She is trying to keep herself busy by reading books, listening to music.  She had a good social network from the charge.  She is taking all her medication and noted increased gabapentin help her anxiety mood and sleep.  She does not want to change the medication.  She denies drinking or using any illegal substances.  Her appetite is okay.  Past Psychiatric  History:Reviewed. H/Odepression andanxiety. TriedWellbutrin, Prozac, Zoloft, amitriptyline and Lexapro. Noh/omania, psychosis, hallucination, suicidal attemptorinpatient.   Psychiatric Specialty Exam: Physical Exam  Review of Systems  Weight 143 lb (64.9 kg), last menstrual period 11/28/2012.There is no height or weight on file to calculate BMI.  General Appearance: NA  Eye Contact:  NA  Speech:  Clear and Coherent  Volume:  Normal  Mood:  Dysphoric  Affect:  NA  Thought Process:  Goal Directed  Orientation:  Full (Time, Place, and Person)  Thought Content:  Rumination  Suicidal Thoughts:  No  Homicidal Thoughts:  No  Memory:  Immediate;   Good Recent;   Good Remote;   Good  Judgement:  Intact  Insight:  Present  Psychomotor Activity:  NA  Concentration:  Concentration: Fair and Attention Span: Fair  Recall:  Good  Fund of Knowledge:  Good  Language:  Good  Akathisia:  No  Handed:  Right  AIMS (if indicated):     Assets:  Communication Skills Desire for Improvement Housing Resilience Social Support Transportation  ADL's:  Intact  Cognition:  WNL  Sleep:   fair      Assessment and Plan: Major depressive disorder, recurrent.  Anxiety.  Grief  Discuss grief counseling.  Encourage to consider and patient promised that she will look into weight.  She has resources and she can also call hospice.  At this time she does not want to change the medication as she  time will take to get better with her grades.  She has a plan to spend time with the mother today.  She has no rash or any itching from the medication.  Continue Lamictal 200 mg daily, trazodone 100 mg at bedtime and gabapentin 400 mg a day.  Recommended to call us back if is any question or any concern.  Follow-up in 3 months.    Follow Up Instructions:    I discussed the assessment and treatment plan with the patient. The patient was provided an opportunity to ask questions and all were answered. The  patient agreed with the plan and demonstrated an understanding of the instructions.   The patient was advised to call back or seek an in-person evaluation if the symptoms worsen or if the condition fails to improve as anticipated.  I provided 17 minutes of non-face-to-face time during this encounter.   Cleotis Nipper, MD

## 2021-04-09 ENCOUNTER — Telehealth (INDEPENDENT_AMBULATORY_CARE_PROVIDER_SITE_OTHER): Payer: BC Managed Care – PPO | Admitting: Psychiatry

## 2021-04-09 ENCOUNTER — Encounter (HOSPITAL_COMMUNITY): Payer: Self-pay | Admitting: Psychiatry

## 2021-04-09 ENCOUNTER — Other Ambulatory Visit: Payer: Self-pay

## 2021-04-09 DIAGNOSIS — F419 Anxiety disorder, unspecified: Secondary | ICD-10-CM

## 2021-04-09 DIAGNOSIS — F330 Major depressive disorder, recurrent, mild: Secondary | ICD-10-CM | POA: Diagnosis not present

## 2021-04-09 MED ORDER — TRAZODONE HCL 100 MG PO TABS: ORAL_TABLET | ORAL | 0 refills | Status: DC

## 2021-04-09 MED ORDER — LAMOTRIGINE 200 MG PO TABS: 200.0000 mg | ORAL_TABLET | Freq: Every day | ORAL | 0 refills | Status: DC

## 2021-04-09 MED ORDER — GABAPENTIN 100 MG PO CAPS: 100.0000 mg | ORAL_CAPSULE | Freq: Four times a day (QID) | ORAL | 0 refills | Status: DC

## 2021-04-09 NOTE — Progress Notes (Signed)
Virtual Visit via Telephone Note  I connected with Jaime [name] on 04/09/21 at  8:20 AM EDT by telephone and verified that I am speaking with the correct person using two identifiers.  Location: Patient: Home  Provider: Home Office   I discussed the limitations, risks, security and privacy concerns of performing an evaluation and management service by telephone and the availability of in person appointments. I also discussed with the patient that there may be a patient responsible charge related to this service. The patient expressed understanding and agreed to proceed.   History of Present Illness: Patient is evaluated by phone session.  She is doing much better with her emotions.  She had few times crying spells specially when she was passing through her friend's house who deceased due to brain cancer.  Patient picked up another house for cleaning and trying to keep herself busy.  She is involved in church activities and keeping the same church because she believed they helped her a lot in this difficult days.  She has a good social network.  She is sleeping better.  She denies any suicidal thoughts or homicidal thoughts.  Her appetite is fair.  She lost few pounds but she is trying to lose weight.  She denies any anger, agitation but sometimes gets emotional when she thinks about her friend.  Her husband is very supportive.  Her mother lives close by and she tried to visit her frequently.  Patient has no tremors, shakes or any EPS.  She is compliant with lamotrigine, gabapentin and trazodone.  She has no rash or any itching.  She decided not to pursue grief counseling because her friends and church relation helping to cope with the grief.  Past Psychiatric History: Reviewed. H/O depression and anxiety. Tried Wellbutrin, Prozac, Zoloft, amitriptyline and Lexapro.  No h/o mania, psychosis, hallucination, suicidal attempt or inpatient.   Psychiatric Specialty Exam: Physical Exam  Review  of Systems  Weight 137 lb (62.1 kg), last menstrual period 11/28/2012.Body mass index is 21.78 kg/m.  General Appearance: NA  Eye Contact:  NA  Speech:  Clear and Coherent and Normal Rate  Volume:  Normal  Mood:   sometimes emotional  Affect:  NA  Thought Process:  Goal Directed  Orientation:  Full (Time, Place, and Person)  Thought Content:  WDL  Suicidal Thoughts:  No  Homicidal Thoughts:  No  Memory:  Immediate;   Good Recent;   Good Remote;   Good  Judgement:  Intact  Insight:  Present  Psychomotor Activity:  NA  Concentration:  Concentration: Good and Attention Span: Good  Recall:  Good  Fund of Knowledge:  Good  Language:  Good  Akathisia:  No  Handed:  Right  AIMS (if indicated):     Assets:  Communication Skills Desire for Improvement Housing Resilience Social Support Transportation  ADL's:  Intact  Cognition:  WNL  Sleep:   ok      Assessment and Plan: Major depressive disorder, recurrent.  Anxiety.  Grief  Patient coping well with the grief as she has a good network with the church and friends.  She does not want to change the medication as she feels it is helping and her symptoms are manageable.  Continue lamotrigine 200 mg daily, trazodone 100 mg at bedtime and gabapentin 4 mg daily.  Recommended to call us back if she has any question or any concern.  Follow-up in 3 months.    Follow Up Instructions:    I  discussed the assessment and treatment plan with the patient. The patient was provided an opportunity to ask questions and all were answered. The patient agreed with the plan and demonstrated an understanding of the instructions.   The patient was advised to call back or seek an in-person evaluation if the symptoms worsen or if the condition fails to improve as anticipated.  I provided 16 minutes of non-face-to-face time during this encounter.   Kathlee Nations, MD

## 2021-07-10 ENCOUNTER — Telehealth (HOSPITAL_BASED_OUTPATIENT_CLINIC_OR_DEPARTMENT_OTHER): Payer: BC Managed Care – PPO | Admitting: Psychiatry

## 2021-07-10 ENCOUNTER — Encounter (HOSPITAL_COMMUNITY): Payer: Self-pay | Admitting: Psychiatry

## 2021-07-10 ENCOUNTER — Other Ambulatory Visit: Payer: Self-pay

## 2021-07-10 DIAGNOSIS — F330 Major depressive disorder, recurrent, mild: Secondary | ICD-10-CM

## 2021-07-10 DIAGNOSIS — F419 Anxiety disorder, unspecified: Secondary | ICD-10-CM

## 2021-07-10 MED ORDER — GABAPENTIN 100 MG PO CAPS: ORAL_CAPSULE | ORAL | 0 refills | Status: DC

## 2021-07-10 MED ORDER — TRAZODONE HCL 100 MG PO TABS: ORAL_TABLET | ORAL | 0 refills | Status: DC

## 2021-07-10 MED ORDER — LAMOTRIGINE 200 MG PO TABS: 200.0000 mg | ORAL_TABLET | Freq: Every day | ORAL | 0 refills | Status: DC

## 2021-07-10 NOTE — Progress Notes (Signed)
Virtual Visit via Telephone Note  I connected with Jaime [name] on 07/10/21 at  9:20 AM EST by telephone and verified that I am speaking with the correct person using two identifiers.  Location: Patient: Home Provider: Home Office   I discussed the limitations, risks, security and privacy concerns of performing an evaluation and management service by telephone and the availability of in person appointments. I also discussed with the patient that there may be a patient responsible charge related to this service. The patient expressed understanding and agreed to proceed.   History of Present Illness: Patient is evaluated by phone session.  She admitted lately more stressed out and getting emotional and tearful.  She feels too many things going on in her life.  She is missing her deceased best friend who died due to brain cancer and lately having issues with the car dealer.  She has to go multiple times to the car dealer to fix the car and now she is I have a loner because they did not clean her car.  She is sleeping fairly.  She admitted sometimes getting irritable and angry but denies any paranoia, hallucination or any suicidal thoughts.  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  She understand that she is going through grief and now holidays are coming she wants to get better.  Her husband [name] is very supportive.  Her mother lives close by and she also tried to visit her frequently.  Since discharge is 45 minutes away she cannot go to church regularly but talking to her mother helps her a lot.  She has not decided yet grief counseling because she feels her mother is very supportive and she can talk to her.  Her appetite is okay.  Her weight is unchanged from the past.     Past Psychiatric History: Reviewed. H/O depression and anxiety. Tried Wellbutrin, Prozac, Zoloft, amitriptyline and Lexapro.  No h/o mania, psychosis,  hallucination, suicidal attempt or inpatient.   Psychiatric Specialty Exam: Physical Exam  Review of Systems  Weight 135 lb (61.2 kg), last menstrual period 11/28/2012.There is no height or weight on file to calculate BMI.  General Appearance: NA  Eye Contact:  NA  Speech:  Clear and Coherent  Volume:  Normal  Mood:  Anxious  Affect:  NA  Thought Process:  Goal Directed  Orientation:  Full (Time, Place, and Person)  Thought Content:  emotional, tearful  Suicidal Thoughts:  No  Homicidal Thoughts:  No  Memory:  Immediate;   Good Recent;   Good Remote;   Good  Judgement:  Intact  Insight:  Present  Psychomotor Activity:  NA  Concentration:  Concentration: Good and Attention Span: Good  Recall:  Good  Fund of Knowledge:  Good  Language:  Good  Akathisia:  No  Handed:  Right  AIMS (if indicated):     Assets:  Communication Skills Desire for Improvement Housing Resilience Transportation  ADL's:  Intact  Cognition:  WNL  Sleep:   fair      Assessment and Plan: Major depressive disorder, recurrent.  Anxiety.  Grief.  Patient admitted more emotional lately not sure why.  We talked about considering grief counseling as that may be one of the reason she is emotional.  Her best friend died due to brain cancer recently.  We also talked about optimizing gabapentin to take 5 times a day.  She agreed to give a try.  She will take the gabapentin 100 mg at 6 AM, 10 AM, 2 PM, 6 PM and bedtime.  We will continue trazodone 200 mg at bedtime and Lamictal 200 mg daily.  Patient reluctant to change any other medication.  Recommended to call us back if there is any question or any concern.  Follow-up in 3 months.   Follow Up Instructions:    I discussed the assessment and treatment plan with the patient. The patient was provided an opportunity to ask questions and all were answered. The patient agreed with the plan and demonstrated an understanding of the instructions.   The patient was  advised to call back or seek an in-person evaluation if the symptoms worsen or if the condition fails to improve as anticipated.  I provided 20 minutes of non-face-to-face time during this encounter.   Cleotis Nipper, MD

## 2021-10-05 ENCOUNTER — Other Ambulatory Visit: Payer: Self-pay | Admitting: Obstetrics and Gynecology

## 2021-10-05 DIAGNOSIS — Z1231 Encounter for screening mammogram for malignant neoplasm of breast: Secondary | ICD-10-CM

## 2021-10-09 ENCOUNTER — Telehealth (HOSPITAL_BASED_OUTPATIENT_CLINIC_OR_DEPARTMENT_OTHER): Payer: BC Managed Care – PPO | Admitting: Psychiatry

## 2021-10-09 ENCOUNTER — Other Ambulatory Visit: Payer: Self-pay

## 2021-10-09 ENCOUNTER — Encounter (HOSPITAL_COMMUNITY): Payer: Self-pay | Admitting: Psychiatry

## 2021-10-09 DIAGNOSIS — F419 Anxiety disorder, unspecified: Secondary | ICD-10-CM | POA: Diagnosis not present

## 2021-10-09 DIAGNOSIS — F330 Major depressive disorder, recurrent, mild: Secondary | ICD-10-CM

## 2021-10-09 MED ORDER — LAMOTRIGINE 200 MG PO TABS: 200.0000 mg | ORAL_TABLET | Freq: Every day | ORAL | 0 refills | Status: DC

## 2021-10-09 MED ORDER — GABAPENTIN 100 MG PO CAPS: ORAL_CAPSULE | ORAL | 0 refills | Status: DC

## 2021-10-09 MED ORDER — TRAZODONE HCL 100 MG PO TABS: ORAL_TABLET | ORAL | 0 refills | Status: DC

## 2021-10-09 NOTE — Progress Notes (Signed)
Virtual Visit via Telephone Note  I connected with Jaime [name] on 10/09/21 at  8:40 AM EST by telephone and verified that I am speaking with the correct person using two identifiers.  Location: Patient: Home Provider: Home Office   I discussed the limitations, risks, security and privacy concerns of performing an evaluation and management service by telephone and the availability of in person appointments. I also discussed with the patient that there may be a patient responsible charge related to this service. The patient expressed understanding and agreed to proceed.   History of Present Illness: Patient is evaluated by phone session.  She is doing better since we increased the gabapentin.  She is not taking 5 times a day.  She has few times crying when she thinks about her deceased best friend but otherwise she is sleeping good.  She denies any major panic attack, feeling of hopelessness.  She is able to manage her emotions much better than before.  Patient told she has one issue with a customer and she handled it very well and she let her go and able to find a different client.  She feels things are going very well.  She had a good support from her husband.  She had a good Christmas as she spent time with her husband's family.  Her mother lives close by and she does try to visit her frequently.  She started church with her husband and that is helping her a lot.  She is sleeping better.  She denies any paranoia, hallucination, suicidal thoughts.  Her appetite is okay.  Her weight is stable.  Her last blood work was done in October and her thyroid, BUN/creatinine and CBC were normal.  Patient has no tremor or shakes or any EPS.  She like to keep the current medication.   Past Psychiatric History: Reviewed. H/O depression and anxiety. Tried Wellbutrin, Prozac, Zoloft, amitriptyline and Lexapro.  No h/o mania, psychosis, hallucination, suicidal attempt or inpatient.    Psychiatric  Specialty Exam: Physical Exam  Review of Systems  Weight 136 lb (61.7 kg), last menstrual period 11/28/2012.There is no height or weight on file to calculate BMI.  General Appearance: NA  Eye Contact:  NA  Speech:  Clear and Coherent  Volume:  Normal  Mood:  Euthymic  Affect:  NA  Thought Process:  Goal Directed  Orientation:  Full (Time, Place, and Person)  Thought Content:  Logical  Suicidal Thoughts:  No  Homicidal Thoughts:  No  Memory:  Immediate;   Good Recent;   Good Remote;   Good  Judgement:  Good  Insight:  Present  Psychomotor Activity:  NA  Concentration:  Concentration: Good and Attention Span: Good  Recall:  Good  Fund of Knowledge:  Good  Language:  Good  Akathisia:  No  Handed:  Right  AIMS (if indicated):     Assets:  Communication Skills Desire for Improvement Housing Resilience Social Support Talents/Skills Transportation  ADL's:  Intact  Cognition:  WNL  Sleep:   good      Assessment and Plan: Major depressive disorder, recurrent.  Anxiety.  Patient doing better after adjusting the gabapentin.  She is taking 5 times a day.  Discussed medication side effects and benefits.  Patient like to keep the current medication.  We will continue trazodone 200 mg at bedtime, Lamictal 200 mg daily and gabapentin 100 mg at 6 AM, 10 AM, 2 PM, 6 PM and at bedtime.  Recommended to call us  back if there is any question or any concern.  I reviewed blood work results which was done in October.  Labs are normal.  Follow-up in 3 months.  I recommend next visit will be a video visit and she agreed.  Follow Up Instructions:    I discussed the assessment and treatment plan with the patient. The patient was provided an opportunity to ask questions and all were answered. The patient agreed with the plan and demonstrated an understanding of the instructions.   The patient was advised to call back or seek an in-person evaluation if the symptoms worsen or if the condition  fails to improve as anticipated.  I provided 18 minutes of non-face-to-face time during this encounter.   Kathlee Nations, MD

## 2021-10-27 ENCOUNTER — Ambulatory Visit
Admission: RE | Admit: 2021-10-27 | Discharge: 2021-10-27 | Disposition: A | Discharge status: Home / Self Care | Payer: BC Managed Care – PPO | Source: Ambulatory Visit | Attending: Obstetrics and Gynecology | Admitting: Obstetrics and Gynecology

## 2021-10-27 DIAGNOSIS — Z1231 Encounter for screening mammogram for malignant neoplasm of breast: Secondary | ICD-10-CM

## 2021-10-28 ENCOUNTER — Ambulatory Visit
Admission: RE | Admit: 2021-10-28 | Discharge: 2021-10-28 | Disposition: A | Discharge status: Home / Self Care | Payer: BC Managed Care – PPO | Source: Ambulatory Visit | Attending: Obstetrics and Gynecology | Admitting: Obstetrics and Gynecology

## 2022-01-08 ENCOUNTER — Encounter (HOSPITAL_COMMUNITY): Payer: Self-pay | Admitting: Psychiatry

## 2022-01-08 ENCOUNTER — Telehealth (HOSPITAL_BASED_OUTPATIENT_CLINIC_OR_DEPARTMENT_OTHER): Payer: BC Managed Care – PPO | Admitting: Psychiatry

## 2022-01-08 DIAGNOSIS — F330 Major depressive disorder, recurrent, mild: Secondary | ICD-10-CM

## 2022-01-08 DIAGNOSIS — F419 Anxiety disorder, unspecified: Secondary | ICD-10-CM | POA: Diagnosis not present

## 2022-01-08 MED ORDER — LAMOTRIGINE 200 MG PO TABS: 200.0000 mg | ORAL_TABLET | Freq: Every day | ORAL | 0 refills | Status: DC

## 2022-01-08 MED ORDER — TRAZODONE HCL 100 MG PO TABS: ORAL_TABLET | ORAL | 0 refills | Status: DC

## 2022-01-08 MED ORDER — GABAPENTIN 100 MG PO CAPS: ORAL_CAPSULE | ORAL | 0 refills | Status: DC

## 2022-01-08 NOTE — Progress Notes (Signed)
Virtual Visit via Telephone Note ? ?I connected with Jaime [name] on 01/08/22 at  8:40 AM EDT by telephone and verified that I am speaking with the correct person using two identifiers. ? ?Location: ?Patient: Friend Home ?Provider: Home Office ?  ?I discussed the limitations, risks, security and privacy concerns of performing an evaluation and management service by telephone and the availability of in person appointments. I also discussed with the patient that there may be a patient responsible charge related to this service. The patient expressed understanding and agreed to proceed. ? ? ?History of Present Illness: ?Patient is evaluated by phone session.  Today she feels more nervous and anxious because she could not able to log in on my chart and started to get very nervous.  She called her friend who asked her to come to her place and she is at her friend's house.  Overall she is doing well.  She is sleeping good.  She tried to keep herself busy and working 5 to 6 hours every day 5 days a week.  She also tried to keep herself busy at church.  However she is still there are times when she gets overwhelmed nervous and anxious and very emotional.  She is now learning how to take her time off and relax herself.  She is also learning not to worry about which is not under her control.  Her friends are very helpful.  She denies any feeling of hopelessness or worthlessness.  She denies any major panic attack.  Her appetite is okay.  Her weight is stable.  Since taking the gabapentin Lamictal she feels her emotions are much better.  She takes trazodone that helps her sleep.  She has no tremor or shakes or any EPS.  She denies any feeling of hopelessness or worthlessness.  She denies any suicidal thoughts, paranoia or any hallucination. ? ?Past Psychiatric History: Reviewed. ?H/O depression and anxiety. Tried Wellbutrin, Prozac, Zoloft, amitriptyline and Lexapro.  No h/o mania, psychosis, hallucination, suicidal  attempt or inpatient.  ?  ?Psychiatric Specialty Exam: ?Physical Exam  ?Review of Systems  ?Weight 138 lb (62.6 kg), last menstrual period 11/28/2012.There is no height or weight on file to calculate BMI.  ?General Appearance: NA  ?Eye Contact:  NA  ?Speech:  Clear and Coherent  ?Volume:  Normal  ?Mood:   sometimes emotional  ?Affect:  NA  ?Thought Process:  Goal Directed  ?Orientation:  Full (Time, Place, and Person)  ?Thought Content:  Logical  ?Suicidal Thoughts:  No  ?Homicidal Thoughts:  No  ?Memory:  Immediate;   Good ?Recent;   Good ?Remote;   Good  ?Judgement:  Good  ?Insight:  Present  ?Psychomotor Activity:  NA  ?Concentration:  Concentration: Good and Attention Span: Good  ?Recall:  Good  ?Fund of Knowledge:  Good  ?Language:  Good  ?Akathisia:  No  ?Handed:  Right  ?AIMS (if indicated):     ?Assets:  Communication Skills ?Desire for Improvement ?Housing ?Resilience ?Social Support ?Transportation  ?ADL's:  Intact  ?Cognition:  WNL  ?Sleep:   ok  ? ? ? ? ?Assessment and Plan: ?Major depressive disorder, recurrent.  Anxiety. ? ?Reassurance given.  Recommended to have her phone which has camera and will help to log him.  Patient acknowledged.  She does not want to change the medication since it is working well.  I will continue trazodone 100 mg at bedtime, gabapentin 100 mg at 6 AM, 10 AM, 2 PM, 6 PM  and at bedtime.  Continue Lamictal 200 mg daily.  She has no rash or any itching.  Recommended to call us back if she has any question, concern if she feels worsening of the symptoms.  Follow up in 3 months. ? ?Follow Up Instructions: ? ?  ?I discussed the assessment and treatment plan with the patient. The patient was provided an opportunity to ask questions and all were answered. The patient agreed with the plan and demonstrated an understanding of the instructions. ?  ?The patient was advised to call back or seek an in-person evaluation if the symptoms worsen or if the condition fails to improve as  anticipated. ? ?Collaboration of Care: Primary Care Provider AEB notes are available in epic to review. ? ?Patient/Guardian was advised Release of Information must be obtained prior to any record release in order to collaborate their care with an outside provider. Patient/Guardian was advised if they have not already done so to contact the registration department to sign all necessary forms in order for [place] to release information regarding their care.  ? ?Consent: Patient/Guardian gives verbal consent for treatment and assignment of benefits for services provided during this visit. Patient/Guardian expressed understanding and agreed to proceed.   ? ?I provided 23 minutes of non-face-to-face time during this encounter. ? ? ?Cleotis Nipper, MD  ?

## 2022-02-25 ENCOUNTER — Telehealth (HOSPITAL_COMMUNITY): Payer: Self-pay

## 2022-02-25 NOTE — Telephone Encounter (Signed)
She can try trazodone 1-1/2 tablet every night.  Her current dose is 100 mg so she will take 150 mg at bedtime.  She can also try taking extra gabapentin at bedtime so total of 6 pills a day.   If symptoms do not improve in few days then she should call next week and will consider adjusting her Lamictal.

## 2022-02-25 NOTE — Telephone Encounter (Signed)
Medication management - Telephone call with patient, after she left a voice message she is experiencing increased depression. going though some marital difficulties and questions if she can go up on medication or have something to help. Patient denied any suicidal or homicidal ideations and was not sure which medication she thought might help to go up on.  Patient stated she is not sleeping as well and questions if Gabapentin may need to be increased.  Agreed to send request and report to Dr. Lolly Mustache that patient is experiencing increased signs and symptoms of depression, not sleeping, crying more, and requests some type of medication adjustment.

## 2022-02-25 NOTE — Telephone Encounter (Signed)
Medication management - Telephone call with pt to provide Dr. Sheela Stack instructions.  Pt to go up on her Trazodone to 1 and 1/2 tablets at bedtime (150 mg total) and to increase her Gabapentin to 100 mg, one at 6am, 10 am, 2pm, 6pm and then 2 at bedtime. Patient stated these back to this nurse and understanding.  Patient also agreed if anything worsened and she needed emergent or immediate care she would seek this out at the new [facility], or her closest local ED.  Patient agreed to call our office back in the coming week to let Dr. Lolly Mustache know how these changes are working and if will need new orders for them then and also if she may need any adjustments to her Lamictal then too, per Dr. Lolly Mustache. Patient stated understanding all instructions and was appreciative of call back and assistance.

## 2022-03-09 ENCOUNTER — Encounter (HOSPITAL_COMMUNITY): Payer: Self-pay | Admitting: Psychiatry

## 2022-03-09 ENCOUNTER — Telehealth (HOSPITAL_BASED_OUTPATIENT_CLINIC_OR_DEPARTMENT_OTHER): Payer: BC Managed Care – PPO | Admitting: Psychiatry

## 2022-03-09 VITALS — Wt 137.8 lb

## 2022-03-09 DIAGNOSIS — F419 Anxiety disorder, unspecified: Secondary | ICD-10-CM

## 2022-03-09 DIAGNOSIS — F330 Major depressive disorder, recurrent, mild: Secondary | ICD-10-CM

## 2022-03-09 DIAGNOSIS — Z630 Problems in relationship with spouse or partner: Secondary | ICD-10-CM

## 2022-03-09 MED ORDER — TRAZODONE HCL 150 MG PO TABS: 150.0000 mg | ORAL_TABLET | Freq: Every day | ORAL | 0 refills | Status: DC

## 2022-03-09 MED ORDER — GABAPENTIN 100 MG PO CAPS: ORAL_CAPSULE | ORAL | 0 refills | Status: DC
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
## 2022-03-31 IMAGING — US US BREAST*R* LIMITED INC AXILLA
1 series · 5 of 5 positions shown · non-contrast
Comparison: Previous exam(s).

CLINICAL DATA: Patient was recalled from screening mammogram for a
possible asymmetry in the right breast.

EXAM:
DIGITAL DIAGNOSTIC UNILATERAL RIGHT MAMMOGRAM WITH TOMOSYNTHESIS AND
CAD; ULTRASOUND RIGHT BREAST LIMITED
TECHNIQUE: Right digital diagnostic mammography and breast tomosynthesis was
performed. The images were evaluated with computer-aided detection.;
Targeted ultrasound examination of the right breast was performed

[Series 1: us breast*right* limited inc axilla · 0.06mm/px · 5 of 5 slices shown]
[im 1/5]
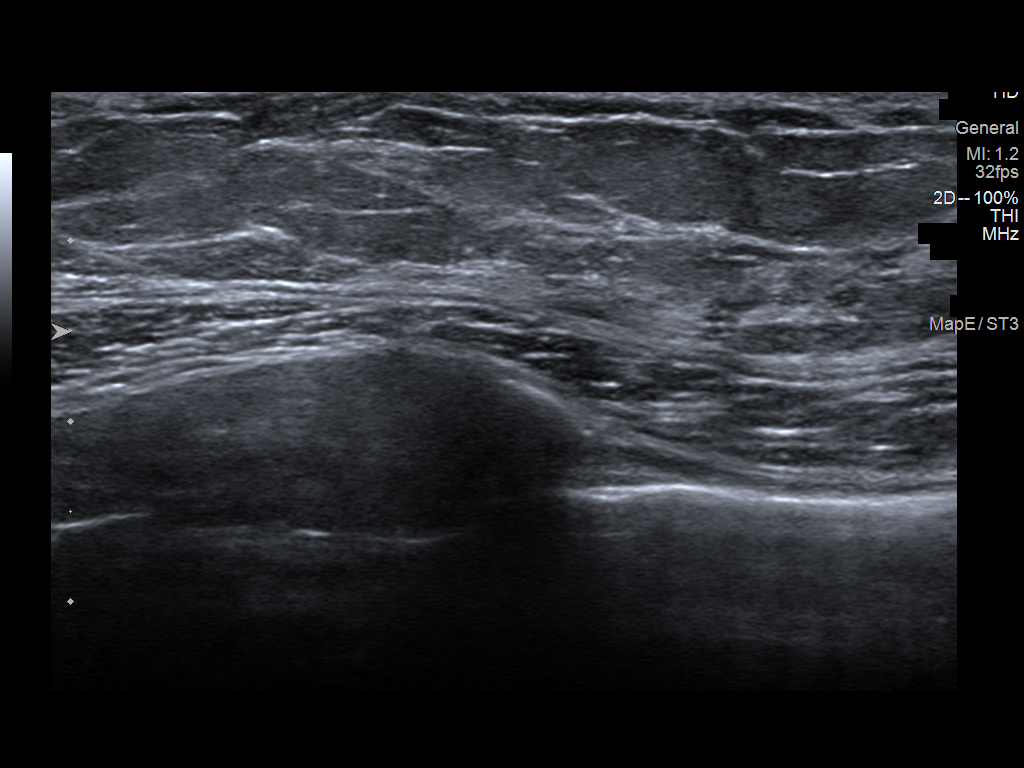
[im 2/5]
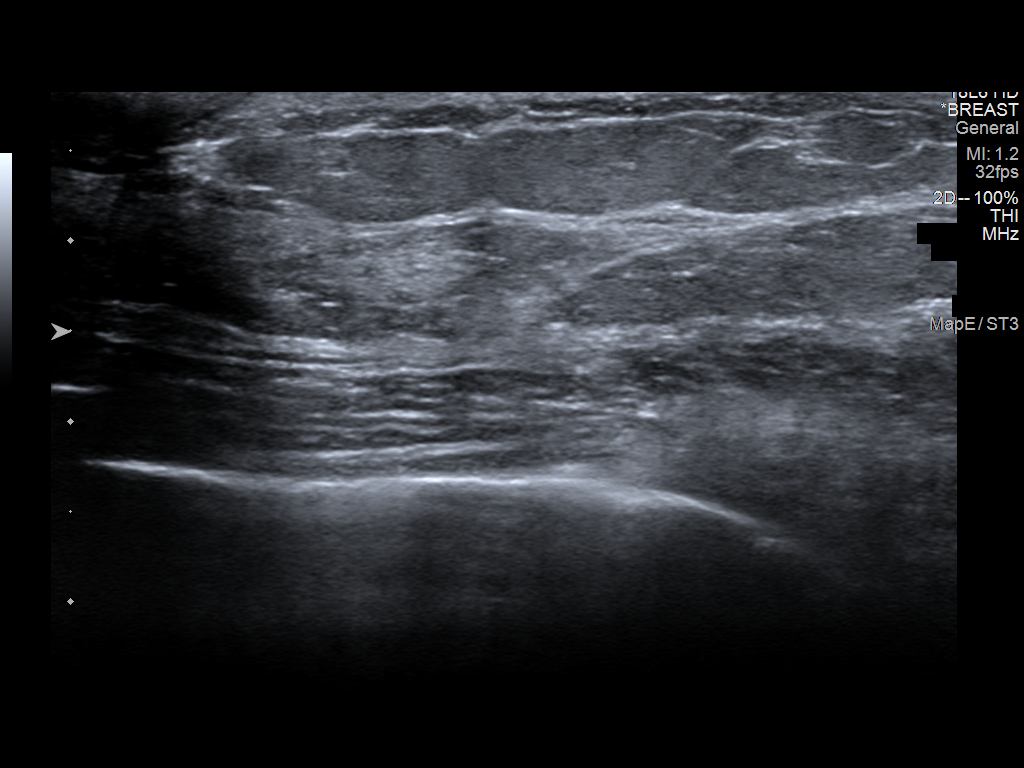
[im 3/5]
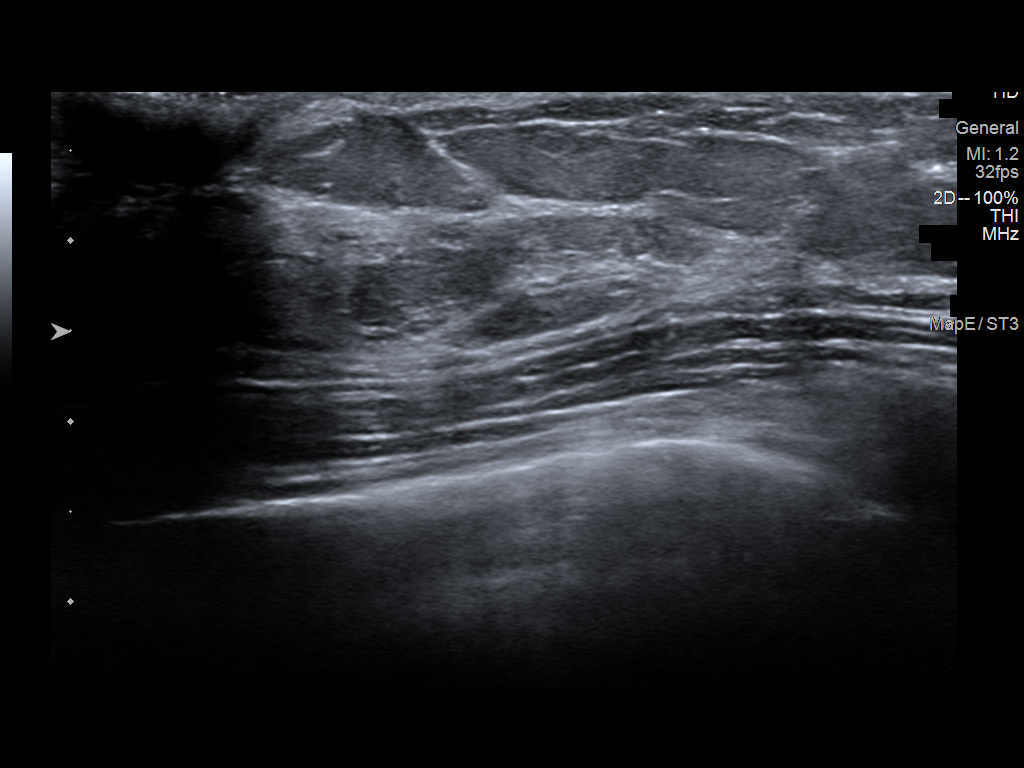
[im 4/5]
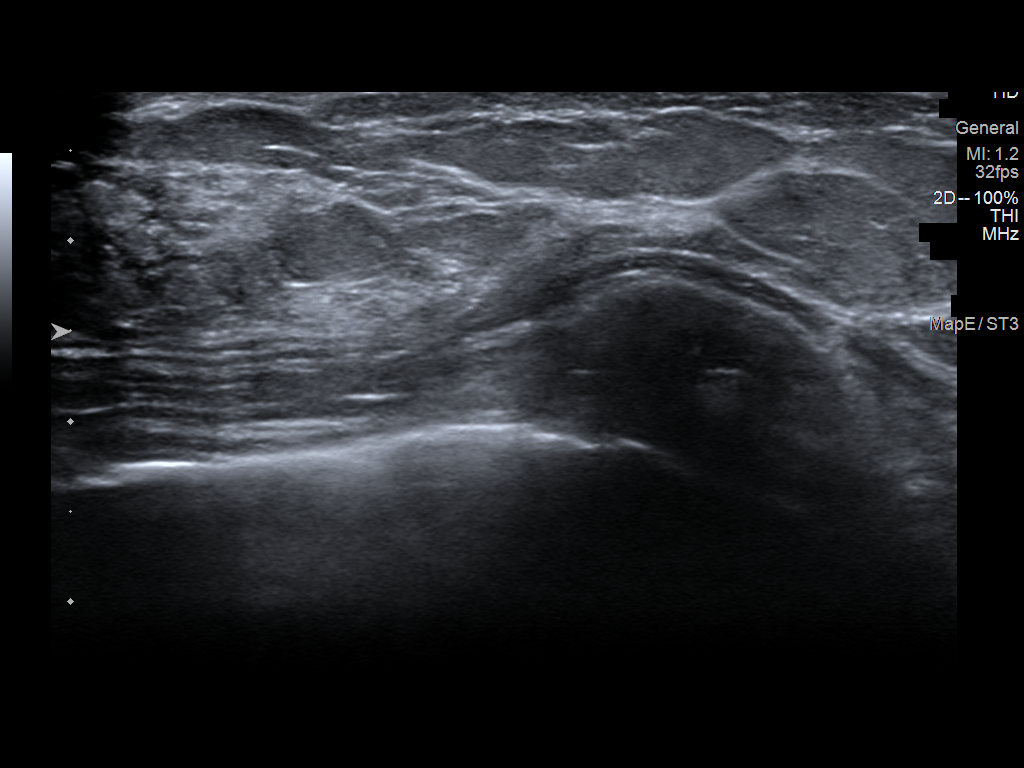
[im 5/5]
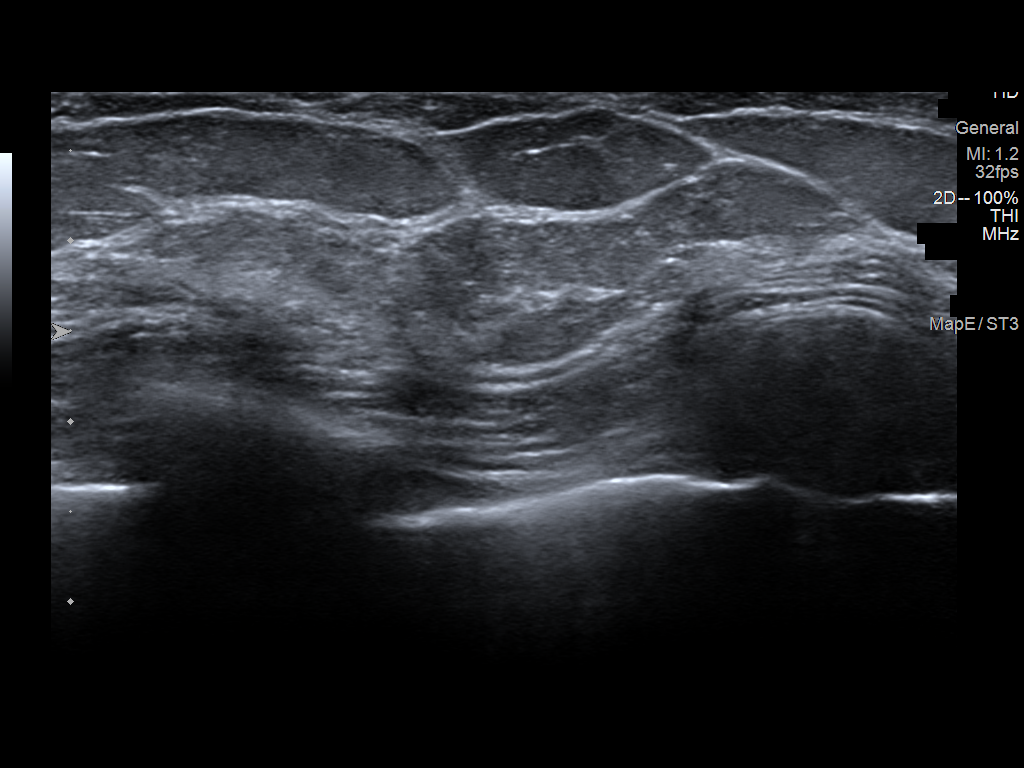

[5 of 5 positions shown; findings below may reference images not displayed]

ACR Breast Density Category c: The breast tissue is heterogeneously
dense, which may obscure small masses.
FINDINGS: Additional imaging of the right breast was performed. There is no
persistent mass, distortion or malignant type microcalcifications.
Parenchymal pattern appears stable compared to prior exams.

Targeted ultrasound is performed, showing normal tissue in the
medial aspect of the right breast. No solid or cystic mass, abnormal
shadowing or distortion visualized.
IMPRESSION: No evidence of malignancy in the right breast.

RECOMMENDATION:
Bilateral screening mammogram in Thursday September, 2021 is recommended.

I have discussed the findings and recommendations with the patient.
If applicable, a reminder letter will be sent to the patient
regarding the next appointment.

BI-RADS CATEGORY  1: Negative.

## 2022-04-08 ENCOUNTER — Telehealth (HOSPITAL_BASED_OUTPATIENT_CLINIC_OR_DEPARTMENT_OTHER): Payer: BC Managed Care – PPO | Admitting: Psychiatry

## 2022-04-08 ENCOUNTER — Encounter (HOSPITAL_COMMUNITY): Payer: Self-pay | Admitting: Psychiatry

## 2022-04-08 VITALS — Wt 137.0 lb

## 2022-04-08 DIAGNOSIS — F330 Major depressive disorder, recurrent, mild: Secondary | ICD-10-CM

## 2022-04-08 DIAGNOSIS — F419 Anxiety disorder, unspecified: Secondary | ICD-10-CM

## 2022-04-08 DIAGNOSIS — Z630 Problems in relationship with spouse or partner: Secondary | ICD-10-CM | POA: Diagnosis not present

## 2022-04-08 MED ORDER — LAMOTRIGINE 200 MG PO TABS: 200.0000 mg | ORAL_TABLET | Freq: Every day | ORAL | 0 refills | Status: DC

## 2022-04-08 MED ORDER — TRAZODONE HCL 150 MG PO TABS: 150.0000 mg | ORAL_TABLET | Freq: Every day | ORAL | 0 refills | Status: DC

## 2022-04-08 MED ORDER — DOXEPIN HCL 10 MG PO CAPS: 10.0000 mg | ORAL_CAPSULE | Freq: Every day | ORAL | 1 refills | Status: DC

## 2022-04-08 MED ORDER — GABAPENTIN 100 MG PO CAPS: ORAL_CAPSULE | ORAL | 1 refills | Status: DC
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
## 2022-04-09 ENCOUNTER — Telehealth (HOSPITAL_COMMUNITY): Payer: BC Managed Care – PPO | Admitting: Psychiatry

## 2022-04-12 ENCOUNTER — Encounter (HOSPITAL_COMMUNITY): Payer: Self-pay

## 2022-04-12 ENCOUNTER — Ambulatory Visit (INDEPENDENT_AMBULATORY_CARE_PROVIDER_SITE_OTHER): Payer: BC Managed Care – PPO | Admitting: Licensed Clinical Social Worker

## 2022-04-12 DIAGNOSIS — Z630 Problems in relationship with spouse or partner: Secondary | ICD-10-CM

## 2022-04-12 DIAGNOSIS — F330 Major depressive disorder, recurrent, mild: Secondary | ICD-10-CM | POA: Diagnosis not present

## 2022-04-12 DIAGNOSIS — F419 Anxiety disorder, unspecified: Secondary | ICD-10-CM

## 2022-04-12 NOTE — Progress Notes (Unsigned)
Virtual Visit via Video Note  I connected with Jaime [name] on 04/12/22 at  8:00 AM EDT by a video enabled telemedicine application and verified that I am speaking with the correct person using two identifiers.  Location: Patient: home/in personal vehicle Provider: remote office [place], [place])   I discussed the limitations of evaluation and management by telemedicine and the availability of in person appointments. The patient expressed understanding and agreed to proceed.  I discussed the assessment and treatment plan with the patient. The patient was provided an opportunity to ask questions and all were answered. The patient agreed with the plan and demonstrated an understanding of the instructions.   The patient was advised to call back or seek an in-person evaluation if the symptoms worsen or if the condition fails to improve as anticipated.  I provided 60 minutes of non-face-to-face time during this encounter.   Ernest Haber Nel Stoneking, LCSW   Comprehensive Clinical Assessment (CCA) Note  04/12/2022 Jaime [name] [id number]  Chief Complaint:  Chief Complaint  Patient presents with   Establish Care   Anxiety   Depression   Visit Diagnosis:  Encounter Diagnoses  Name Primary?   MDD (major depressive disorder), recurrent episode, mild (HCC) Yes   Anxiety    Marital stress      CCA Screening, Triage and Referral (STR)  Patient Reported Information How did you hear about [place]? No data recorded Referral name: No data recorded Referral phone number: No data recorded  Whom do you see for routine medical problems? No data recorded Practice/Facility Name: No data recorded Practice/Facility Phone Number: No data recorded Name of Contact: No data recorded Contact Number: No data recorded Contact Fax Number: No data recorded Prescriber Name: No data recorded Prescriber Address (if known): No data recorded  What Is the Reason for Your Visit/Call Today? No  data recorded How Long Has This Been Causing You Problems? No data recorded What Do You Feel Would Help You the Most Today? No data recorded  Have You Recently Been in Any Inpatient Treatment (Hospital/Detox/Crisis Center/28-Day Program)? No data recorded Name/Location of Program/Hospital:No data recorded How Long Were You There? No data recorded When Were You Discharged? No data recorded  Have You Ever Received Services From [facility] Before? No data recorded Who Do You See at [facility]? No data recorded  Have You Recently Had Any Thoughts About Hurting Yourself? No data recorded Are You Planning to Commit Suicide/Harm Yourself At This time? No data recorded  Have you Recently Had Thoughts About Hurting Someone Karolee Ohs? No data recorded Explanation: No data recorded  Have You Used Any Alcohol or Drugs in the Past 24 Hours? No data recorded How Long Ago Did You Use Drugs or Alcohol? No data recorded What Did You Use and How Much? No data recorded  Do You Currently Have a Therapist/Psychiatrist? No data recorded Name of Therapist/Psychiatrist: No data recorded  Have You Been Recently Discharged From Any Office Practice or Programs? No data recorded Explanation of Discharge From Practice/Program: No data recorded    CCA Screening Triage Referral Assessment Type of Contact: No data recorded Is this Initial or Reassessment? No data recorded Date Telepsych consult ordered in [facility]:  No data recorded Time Telepsych consult ordered in [facility]:  No data recorded  Patient Reported Information Reviewed? No data recorded Patient Left Without Being Seen? No data recorded Reason for Not Completing Assessment: No data recorded  Collateral Involvement: No data recorded  Does Patient Have a Court Appointed  Legal Guardian? No data recorded Name and Contact of Legal Guardian: No data recorded If Minor and Not Living with Parent(s), Who has Custody? No data recorded Is CPS involved or ever been  involved? No data recorded Is APS involved or ever been involved? No data recorded  Patient Determined To Be At Risk for Harm To Self or Others Based on Review of Patient Reported Information or Presenting Complaint? No data recorded Method: No data recorded Availability of Means: No data recorded Intent: No data recorded Notification Required: No data recorded Additional Information for Danger to Others Potential: No data recorded Additional Comments for Danger to Others Potential: No data recorded Are There Guns or Other Weapons in Your Home? No data recorded Types of Guns/Weapons: No data recorded Are These Weapons Safely Secured?                            No data recorded Who Could Verify You Are Able To Have These Secured: No data recorded Do You Have any Outstanding Charges, Pending Court Dates, Parole/Probation? No data recorded Contacted To Inform of Risk of Harm To Self or Others: No data recorded  Location of Assessment: No data recorded  Does Patient Present under Involuntary Commitment? No data recorded IVC Papers Initial File Date: No data recorded  [place] of Residence: No data recorded  Patient Currently Receiving the Following Services: No data recorded  Determination of Need: No data recorded  Options For Referral: No data recorded    CCA Biopsychosocial Intake/Chief Complaint:  No data recorded Current Symptoms/Problems: No data recorded  Patient Reported Schizophrenia/Schizoaffective Diagnosis in Past: No data recorded  Strengths: No data recorded Preferences: No data recorded Abilities: No data recorded  Type of Services Patient Feels are Needed: No data recorded  Initial Clinical Notes/Concerns: No data recorded  Mental Health Symptoms Depression:  No data recorded  Duration of Depressive symptoms: No data recorded  Mania:  No data recorded  Anxiety:   No data recorded  Psychosis:  No data recorded  Duration of Psychotic symptoms: No data  recorded  Trauma:  No data recorded  Obsessions:  No data recorded  Compulsions:  No data recorded  Inattention:  No data recorded  Hyperactivity/Impulsivity:  No data recorded  Oppositional/Defiant Behaviors:  No data recorded  Emotional Irregularity:  No data recorded  Other Mood/Personality Symptoms:  No data recorded   Mental Status Exam Appearance and self-care  Stature:  No data recorded  Weight:  No data recorded  Clothing:  No data recorded  Grooming:  No data recorded  Cosmetic use:  No data recorded  Posture/gait:  No data recorded  Motor activity:  No data recorded  Sensorium  Attention:  No data recorded  Concentration:  No data recorded  Orientation:  No data recorded  Recall/memory:  No data recorded  Affect and Mood  Affect:  No data recorded  Mood:  No data recorded  Relating  Eye contact:  No data recorded  Facial expression:  No data recorded  Attitude toward examiner:  No data recorded  Thought and Language  Speech flow: No data recorded  Thought content:  No data recorded  Preoccupation:  No data recorded  Hallucinations:  No data recorded  Organization:  No data recorded  [employer] of Knowledge:  No data recorded  Intelligence:  No data recorded  Abstraction:  No data recorded  Judgement:  No data recorded  Reality  Testing:  No data recorded  Insight:  No data recorded  Decision Making:  No data recorded  Social Functioning  Social Maturity:  No data recorded  Social Judgement:  No data recorded  Stress  Stressors:  No data recorded  Coping Ability:  No data recorded  Skill Deficits:  No data recorded  Supports:  No data recorded    Religion:    Leisure/Recreation:    Exercise/Diet:     CCA Employment/Education Employment/Work Situation:    Education:     CCA Family/Childhood History Family and Relationship History:    Childhood History:     Child/Adolescent Assessment:     CCA Substance  Use Alcohol/Drug Use:     ASAM's:  Six Dimensions of Multidimensional Assessment  Dimension 1:  Acute Intoxication and/or Withdrawal Potential:      Dimension 2:  Biomedical Conditions and Complications:      Dimension 3:  Emotional, Behavioral, or Cognitive Conditions and Complications:     Dimension 4:  Readiness to Change:     Dimension 5:  Relapse, Continued use, or Continued Problem Potential:     Dimension 6:  Recovery/Living Environment:     ASAM Severity Score:    ASAM Recommended Level of Treatment:     Substance use Disorder (SUD)    Recommendations for Services/Supports/Treatments:    DSM5 Diagnoses: Patient Active Problem List   Diagnosis Date Noted   Paresthesia 07/18/2017   Major depressive disorder, recurrent episode, moderate (HCC) 05/26/2016   GAD (generalized anxiety disorder) 05/26/2016   Depression 08/28/2014    Patient Centered Plan: Patient is on the following Treatment Plan(s):  Anxiety and Depression   Referrals to Alternative Service(s): Referred to Alternative Service(s):   Place:   Date:   Time:    Referred to Alternative Service(s):   Place:   Date:   Time:    Referred to Alternative Service(s):   Place:   Date:   Time:    Referred to Alternative Service(s):   Place:   Date:   Time:      Collaboration of Care: Other pt encouraged to continue care with psychiatrist of record, Dr. Kathryne Sharper  Patient/Guardian was advised Release of Information must be obtained prior to any record release in order to collaborate their care with an outside provider. Patient/Guardian was advised if they have not already done so to contact the registration department to sign all necessary forms in order for [place] to release information regarding their care.   Consent: Patient/Guardian gives verbal consent for treatment and assignment of benefits for services provided during this visit. Patient/Guardian expressed understanding and agreed to proceed.   Jaimee Corum R  Yoniel Arkwright, LCSW

## 2022-04-12 NOTE — Plan of Care (Signed)
Developed tx plan based on pt self reported input  

## 2022-05-25 ENCOUNTER — Telehealth (HOSPITAL_BASED_OUTPATIENT_CLINIC_OR_DEPARTMENT_OTHER): Payer: BC Managed Care – PPO | Admitting: Psychiatry

## 2022-05-25 ENCOUNTER — Encounter (HOSPITAL_COMMUNITY): Payer: Self-pay | Admitting: Psychiatry

## 2022-05-25 VITALS — Wt 137.0 lb

## 2022-05-25 DIAGNOSIS — F419 Anxiety disorder, unspecified: Secondary | ICD-10-CM

## 2022-05-25 DIAGNOSIS — F330 Major depressive disorder, recurrent, mild: Secondary | ICD-10-CM

## 2022-05-25 DIAGNOSIS — Z630 Problems in relationship with spouse or partner: Secondary | ICD-10-CM

## 2022-05-25 MED ORDER — DOXEPIN HCL 10 MG PO CAPS: 20.0000 mg | ORAL_CAPSULE | Freq: Every day | ORAL | 2 refills | Status: DC

## 2022-05-25 MED ORDER — LAMOTRIGINE 200 MG PO TABS: 200.0000 mg | ORAL_TABLET | Freq: Every day | ORAL | 0 refills | Status: DC

## 2022-05-25 MED ORDER — GABAPENTIN 100 MG PO CAPS: ORAL_CAPSULE | ORAL | 2 refills | Status: DC

## 2022-05-25 MED ORDER — TRAZODONE HCL 150 MG PO TABS: 150.0000 mg | ORAL_TABLET | Freq: Every day | ORAL | 0 refills | Status: DC

## 2022-05-25 NOTE — Progress Notes (Signed)
Virtual Visit via Telephone Note  I connected with Waneta Fitting on 05/25/22 at  4:00 PM EDT by telephone and verified that I am speaking with the correct person using two identifiers.  Location: Patient: home Provider: home office   I discussed the limitations, risks, security and privacy concerns of performing an evaluation and management service by telephone and the availability of in person appointments. I also discussed with the patient that there may be a patient responsible charge related to this service. The patient expressed understanding and agreed to proceed.   History of Present Illness: Patient is evaluated by phone session.  She is doing better with doxepin and her depression and anxiety is better but she still struggle with insomnia.  She reported things are going better with the husband.  She is still living alone in her home but started communicating with the husband slowly and gradually.  She had a one visit with Margreta Journey but like to have more visits in the future.  She denies any major panic attack.  She understand going back into a relationship with the husband may be difficult task but she is taking a lot of precautions before they started living together.  She had a good support from her neighbors, sister and brother-in-law.  She denies any tremors, shakes or any EPS.  She denies any paranoia, hallucination or any suicidal thoughts.  Her appetite is okay and she feels her mood is improved from the past.  Her weight is unchanged from the past.   Past Psychiatric History:  H/O depression and anxiety. Tried Wellbutrin, Prozac, Zoloft, amitriptyline and Lexapro.  No h/o mania, psychosis, hallucination, suicidal attempt or inpatient.   Psychiatric Specialty Exam: Physical Exam  Review of Systems  Weight 137 lb (62.1 kg), last menstrual period 11/28/2012.There is no height or weight on file to calculate BMI.  General Appearance: NA  Eye Contact:  NA  Speech:  Clear  and Coherent  Volume:  Normal  Mood:  Euthymic  Affect:  NA  Thought Process:  Goal Directed  Orientation:  Full (Time, Place, and Person)  Thought Content:  WDL  Suicidal Thoughts:  No  Homicidal Thoughts:  No  Memory:  Immediate;   Good Recent;   Good Remote;   Good  Judgement:  Intact  Insight:  Present  Psychomotor Activity:  NA  Concentration:  Concentration: Good and Attention Span: Good  Recall:  Good  Fund of Knowledge:  Good  Language:  Good  Akathisia:  No  Handed:  Right  AIMS (if indicated):     Assets:  Communication Skills Desire for Improvement Housing Resilience Talents/Skills Transportation  ADL's:  Intact  Cognition:  WNL  Sleep:   fair      Assessment and Plan: Major depressive disorder, recurrent.  Anxiety.  Marital stress.  Patient doing better with doxepin.  She is only taking 1 pill.  I recommend to take 20 mg and cut down her gabapentin to take only 1 pill at bedtime.  She also taking gabapentin during the daytime.  Continue trazodone 150 mg at bedtime.  She has no rash or itching and she will continue lamotrigine and continue therapy with Christina.  So far she is doing better and I recommend to call us back if she has any question concern or if she feels worsening of the symptoms.  We will follow-up in 3 months.  Follow Up Instructions:    I discussed the assessment and treatment plan with the patient. The  patient was provided an opportunity to ask questions and all were answered. The patient agreed with the plan and demonstrated an understanding of the instructions.   The patient was advised to call back or seek an in-person evaluation if the symptoms worsen or if the condition fails to improve as anticipated.  Collaboration of Care: Other provider involved in patient's care AEB notes are available in epic to review.  Patient/Guardian was advised Release of Information must be obtained prior to any record release in order to collaborate  their care with an outside provider. Patient/Guardian was advised if they have not already done so to contact the registration department to sign all necessary forms in order for [place] to release information regarding their care.   Consent: Patient/Guardian gives verbal consent for treatment and assignment of benefits for services provided during this visit. Patient/Guardian expressed understanding and agreed to proceed.    I provided 18 minutes of non-face-to-face time during this encounter.   Cleotis Nipper, MD

## 2022-05-26 ENCOUNTER — Ambulatory Visit (HOSPITAL_COMMUNITY): Payer: BC Managed Care – PPO | Admitting: Licensed Clinical Social Worker

## 2022-05-26 DIAGNOSIS — Z630 Problems in relationship with spouse or partner: Secondary | ICD-10-CM

## 2022-05-26 DIAGNOSIS — F419 Anxiety disorder, unspecified: Secondary | ICD-10-CM

## 2022-05-26 DIAGNOSIS — F330 Major depressive disorder, recurrent, mild: Secondary | ICD-10-CM | POA: Diagnosis not present

## 2022-05-27 NOTE — Progress Notes (Signed)
THERAPIST PROGRESS NOTE  Session Time: [place] in office visit for patient and LCSW clinician  Pt provided verbal consent for student, Lucia Gaskins, to be present throughout session.   Participation Level: Active  Behavioral Response: Neat and Well GroomedAlertAnxious  Type of Therapy: Individual Therapy  Treatment Goals addressed: oblem: Anxiety Disorder  Goal:  Reduce overall frequency, intensity, and duration of the anxiety so that daily functioning is not impaired per pt self report 3 out of 5 sessions documented.   Outcome: Progressing Goal: Enhance ability to effectively cope with the full variety of life's worries and anxiety per self report 3 out of 5 sessions documented  Outcome: Progressing Intervention: Assist with relaxation techniques, as appropriate (deep breathing exercises, meditation, guided imagery) Note: Reviewed/explored Intervention: Discuss self-management skills Note: Allowed pt to explore current behaviors and visualize goal behaviors   Problem: Depression Goal: Decrease depressive symptoms and improve levels of effective functioning-pt reports a decrease in overall depression symptoms 3 out of 5 sessions documented.  Outcome: Progressing Goal: Develop healthy thinking patterns and beliefs about self, others, and the world that lead to the alleviation and help prevent the relapse of depression per self report 3 out of 5 sessions documented.   Outcome: Progressing Intervention: REVIEW PLEASE SKILLS (TREAT PHYSICAL ILLNESS, BALANCE EATING, AVOID MOOD-ALTERING SUBSTANCES, BALANCE SLEEP AND GET EXERCISE) WITH Jaime [name] Note: Reviewed   ProgressTowards Goals: Progressing  Interventions: CBT, Solution Focused, and Supportive  Summary: Jaime [name] is a 56 y.o. female who presents with improving symptoms related to depression and anxiety diagnoses. Pt reports overall mood has improved and that her  anxiety/stress levels are better than at last session. .   Allowed pt to explore and express thoughts and feelings associated with recent life situations and external stressors. Patient reports continuing stress associated with current marital separation. Patient reports that recently her husband has made some positive changes with regard to their marital relationship. Patient reports that her husband is still residing with his parents, but that they are talking more. Patient reports that she feels that her husband is respecting her, and she believes him when he states that he has stopped drinking.  Allowed patient safe space to explore behaviors in the past, behaviors in the present, and her visualization of future behaviors. Explore patients relationship with husband from the beginning until present. Allowed patient to explore her thoughts and her feelings associated with different circumstances that have happened throughout their marriage period explore patients relationship with her husband's family, and their thoughts and feelings about the separation. Explored patients friends and families thoughts and feelings about the current separation.  Reviewed coping skills for managing depression and anxiety symptoms, and encouraged patient to increase self-care behaviors during this stressful time. Patient reflects understanding and willingness to cooperate.  Continued recommendations are as follows: self care behaviors, positive social engagements, focusing on overall work/home/life balance, and focusing on positive physical and emotional wellness.    Suicidal/Homicidal: No  Therapist Response: Pt is continuing to apply interventions learned in session into daily life situations. Pt is currently on track to meet goals utilizing interventions mentioned above. Personal growth and progress noted. Treatment to continue as indicated.   Plan: Return again in 4 weeks.  Diagnosis:  Encounter Diagnoses   Name Primary?   MDD (major depressive disorder), recurrent episode, mild ([place]) Yes   Anxiety    Marital stress    Collaboration of Care: Other pt to continue care with psychiatrist of  record, Dr. Kathryne Sharper  Patient/Guardian was advised Release of Information must be obtained prior to any record release in order to collaborate their care with an outside provider. Patient/Guardian was advised if they have not already done so to contact the registration department to sign all necessary forms in order for [place] to release information regarding their care.   Consent: Patient/Guardian gives verbal consent for treatment and assignment of benefits for services provided during this visit. Patient/Guardian expressed understanding and agreed to proceed.   Ernest Haber Jaime Tench, LCSW 05/27/2022

## 2022-05-27 NOTE — Plan of Care (Signed)
  Problem: Anxiety Disorder  Goal:  Reduce overall frequency, intensity, and duration of the anxiety so that daily functioning is not impaired per pt self report 3 out of 5 sessions documented.   Outcome: Progressing Goal: Enhance ability to effectively cope with the full variety of life's worries and anxiety per self report 3 out of 5 sessions documented  Outcome: Progressing Intervention: Assist with relaxation techniques, as appropriate (deep breathing exercises, meditation, guided imagery) Note: Reviewed/explored Intervention: Discuss self-management skills Note: Allowed pt to explore current behaviors and visualize goal behaviors   Problem: Depression Goal: Decrease depressive symptoms and improve levels of effective functioning-pt reports a decrease in overall depression symptoms 3 out of 5 sessions documented.  Outcome: Progressing Goal: Develop healthy thinking patterns and beliefs about self, others, and the world that lead to the alleviation and help prevent the relapse of depression per self report 3 out of 5 sessions documented.   Outcome: Progressing Intervention: REVIEW PLEASE SKILLS (TREAT PHYSICAL ILLNESS, BALANCE EATING, AVOID MOOD-ALTERING SUBSTANCES, BALANCE SLEEP AND GET EXERCISE) WITH Colletta [place] Note: Reviewed

## 2022-07-08 ENCOUNTER — Ambulatory Visit (HOSPITAL_COMMUNITY): Payer: BC Managed Care – PPO | Admitting: Licensed Clinical Social Worker

## 2022-07-08 DIAGNOSIS — F330 Major depressive disorder, recurrent, mild: Secondary | ICD-10-CM

## 2022-07-08 DIAGNOSIS — Z630 Problems in relationship with spouse or partner: Secondary | ICD-10-CM

## 2022-07-08 DIAGNOSIS — F419 Anxiety disorder, unspecified: Secondary | ICD-10-CM | POA: Diagnosis not present

## 2022-07-09 NOTE — Progress Notes (Signed)
THERAPIST PROGRESS NOTE  Session Time: 315-4p  [facility] in office visit for patient and LCSW clinician  Pt provided verbal consent for LCSW counselor trainee, Carolee Rota , to be present throughout session.   Participation Level: Active  Behavioral Response: Neat and Well GroomedAlertAnxious  Type of Therapy: Individual Therapy  Treatment Goals addressed:   Problem: Anxiety Disorder  Goal:  Reduce overall frequency, intensity, and duration of the anxiety so that daily functioning is not impaired per pt self report 3 out of 5 sessions documented.   Outcome: Progressing Goal: Enhance ability to effectively cope with the full variety of life's worries and anxiety per self report 3 out of 5 sessions documented  Outcome: Progressing Intervention: Help pt identify alternative behaviors to ManageTriggers Note: Assisted with identification and  management strategies.   Problem: Depression Goal: Decrease depressive symptoms and improve levels of effective functioning-pt reports a decrease in overall depression symptoms 3 out of 5 sessions documented.  Outcome: Progressing Goal: Develop healthy thinking patterns and beliefs about self, others, and the world that lead to the alleviation and help prevent the relapse of depression per self report 3 out of 5 sessions documented.   Outcome: Progressing Intervention: Encourage verbalization of feelings/concerns/expectations Note: Allowed pt to explore/express   ProgressTowards Goals: Progressing  Interventions: CBT, Solution Focused, and Supportive  Summary: Jaime [name] is a 56 y.o. female who presents with improving symptoms related to depression and anxiety diagnoses. Pt reports overall mood has improved and that her anxiety/stress levels are better than at last session. .   Allowed pt to explore and express thoughts and feelings associated with recent life situations and external stressors.  Patient reports that since last session, herself and her husband have reconciled and are now currently residing in the same home. Patient reports that her husband has not been drinking, and that he's being intentional about helping out around the house, and helping patient with everyday household duties. Patient reports that she feels that her husband is being intentional about complementing her, and communicating well with her.   Patient reports that her husband started attending church while they were in their separation., and patient is very happy about that. Patient reports that she does have her home church and that her husband attends church where his brother-in-law is a Education officer, environmental. Patient reports that she is thankful that they are both on this spiritual journey together, and they make a point to do daily devotionals together each day. Patient reports that they are going places together, and patient is happy about this.  Patient reports that one thing that she doesn't like is that her husband will bring it up to her about relationships that she has had with other men before their marriage. Discussed common reasons why issues from the past are brought up into present conversations. Allow patient to explore her thoughts and feelings about why her husband keeps bringing this up.  Patient reports that she feels it would be helpful to bring her husband with her to her next appointment--LCSW clinician encouraged patient to do whatever she feels would be most helpful for patients and for relationship. Patient reports that they did meet with another counselor, and patient and husband both feel that it was not helpful.  Continued recommendations are as follows: self care behaviors, positive social engagements, focusing on overall work/home/life balance, and focusing on positive physical and emotional wellness.   Suicidal/Homicidal: No  Therapist Response: Pt is continuing to apply interventions learned  in  session into daily life situations. Pt is currently on track to meet goals utilizing interventions mentioned above. Personal growth and progress noted. Treatment to continue as indicated.   Plan: Return again in 4 weeks.  Diagnosis:  Encounter Diagnoses  Name Primary?   MDD (major depressive disorder), recurrent episode, mild (HCC) Yes   Anxiety    Marital stress    Collaboration of Care: Other pt to continue care with psychiatrist of record, Dr. Kathryne Sharper  Patient/Guardian was advised Release of Information must be obtained prior to any record release in order to collaborate their care with an outside provider. Patient/Guardian was advised if they have not already done so to contact the registration department to sign all necessary forms in order for [place] to release information regarding their care.   Consent: Patient/Guardian gives verbal consent for treatment and assignment of benefits for services provided during this visit. Patient/Guardian expressed understanding and agreed to proceed.   Ernest Haber Jaime Mondesir, LCSW 07/09/2022

## 2022-07-09 NOTE — Plan of Care (Signed)
  Problem: Anxiety Disorder  Goal:  Reduce overall frequency, intensity, and duration of the anxiety so that daily functioning is not impaired per pt self report 3 out of 5 sessions documented.   Outcome: Progressing Goal: Enhance ability to effectively cope with the full variety of life's worries and anxiety per self report 3 out of 5 sessions documented  Outcome: Progressing Intervention: Help pt identify alternative behaviors to ManageTriggers Note: Assisted with identification and  management strategies.   Problem: Depression Goal: Decrease depressive symptoms and improve levels of effective functioning-pt reports a decrease in overall depression symptoms 3 out of 5 sessions documented.  Outcome: Progressing Goal: Develop healthy thinking patterns and beliefs about self, others, and the world that lead to the alleviation and help prevent the relapse of depression per self report 3 out of 5 sessions documented.   Outcome: Progressing Intervention: Encourage verbalization of feelings/concerns/expectations Note: Allowed pt to explore/express

## 2022-08-16 ENCOUNTER — Encounter (HOSPITAL_COMMUNITY): Payer: Self-pay | Admitting: Psychiatry

## 2022-08-16 ENCOUNTER — Telehealth (HOSPITAL_BASED_OUTPATIENT_CLINIC_OR_DEPARTMENT_OTHER): Payer: BC Managed Care – PPO | Admitting: Psychiatry

## 2022-08-16 VITALS — Wt 137.0 lb

## 2022-08-16 DIAGNOSIS — Z630 Problems in relationship with spouse or partner: Secondary | ICD-10-CM | POA: Diagnosis not present

## 2022-08-16 DIAGNOSIS — F419 Anxiety disorder, unspecified: Secondary | ICD-10-CM | POA: Diagnosis not present

## 2022-08-16 DIAGNOSIS — F330 Major depressive disorder, recurrent, mild: Secondary | ICD-10-CM | POA: Diagnosis not present

## 2022-08-16 MED ORDER — ARIPIPRAZOLE 2 MG PO TABS: 2.0000 mg | ORAL_TABLET | Freq: Every day | ORAL | 1 refills | Status: DC

## 2022-08-16 MED ORDER — GABAPENTIN 100 MG PO CAPS: ORAL_CAPSULE | ORAL | 1 refills | Status: DC

## 2022-08-16 MED ORDER — TRAZODONE HCL 150 MG PO TABS: 150.0000 mg | ORAL_TABLET | Freq: Every day | ORAL | 0 refills | Status: DC

## 2022-08-16 MED ORDER — LAMOTRIGINE 200 MG PO TABS: 200.0000 mg | ORAL_TABLET | Freq: Every day | ORAL | 0 refills | Status: DC

## 2022-08-16 NOTE — Progress Notes (Signed)
Virtual Visit via Telephone Note  I connected with Jaime [name] on 08/16/22 at  3:20 PM EST by telephone and verified that I am speaking with the correct person using two identifiers.  Location: Patient: Home Provider: Home Office   I discussed the limitations, risks, security and privacy concerns of performing an evaluation and management service by telephone and the availability of in person appointments. I also discussed with the patient that there may be a patient responsible charge related to this service. The patient expressed understanding and agreed to proceed.   History of Present Illness: Patient is evaluated by phone session.  She noticed since go up on doxepin she feels tired and sometimes could not process her thinking very well.  She continues to get frustrated when her husband asking many questions about her past.  She admitted to getting mad, mean and angry.  She is in therapy with Christina.  She still has marital issues overall better than before.  We have recommended to take 20 mg doxepin along with trazodone, Lamictal.  I recommend to cut down the gabapentin at bedtime.  She has fewer panic attacks but denies any suicidal thoughts or homicidal thoughts.  She is living with her husband but again sometimes very stressful.  She had a good support from her neighbors, sister and brother-in-law.  Her sister and brother-in-law are going to [place] to visit Jaime [name].  Patient has plan to stay here and visit her husband's family.  She has no tremors, shakes or any EPS.  Her work is going well.  She has no tremor or shakes or any EPS.  Her appetite is okay.  Her weight is unchanged from the past.  Past Psychiatric History:  H/O depression and anxiety. Tried Wellbutrin, Prozac, Zoloft, amitriptyline and Lexapro.  No h/o mania, psychosis, hallucination, suicidal attempt or inpatient.    Psychiatric Specialty Exam: Physical Exam  Review of Systems  Weight 137 lb (62.1 kg),  last menstrual period 11/28/2012.There is no height or weight on file to calculate BMI.  General Appearance: NA  Eye Contact:  NA  Speech:  Normal Rate  Volume:  Normal  Mood:  Anxious  Affect:  NA  Thought Process:  Goal Directed  Orientation:  Full (Time, Place, and Person)  Thought Content:  Rumination  Suicidal Thoughts:  No  Homicidal Thoughts:  No  Memory:  Immediate;   Good Recent;   Good Remote;   Good  Judgement:  Intact  Insight:  Present  Psychomotor Activity:  NA  Concentration:  Concentration: Good and Attention Span: Good  Recall:  Good  Fund of Knowledge:  Good  Language:  Good  Akathisia:  No  Handed:  Right  AIMS (if indicated):     Assets:  Communication Skills Desire for Improvement Housing Transportation  ADL's:  Intact  Cognition:  WNL  Sleep:         Assessment and Plan: Major depressive disorder, recurrent.  Anxiety.  Marital stress..  Patient noticed feeling tired and foggy the next day when she is taking 20 mg doxepin.  I recommend discontinue doxepin and try Abilify 2 mg daily.  Keep the gabapentin at present dose which is 5 times a day.  Continue trazodone 150 mg at bedtime.  Will try Abilify 2 mg to help mood lability.  Encouraged to continue therapy with Christina.  Continue Lamictal 200 mg daily.  If the Abilify works we may cut down the gabapentin.  Recommended to call us back if she  has any question, concern or if she feels worsening of the symptoms.  Follow-up in 6 weeks.    Follow Up Instructions:    I discussed the assessment and treatment plan with the patient. The patient was provided an opportunity to ask questions and all were answered. The patient agreed with the plan and demonstrated an understanding of the instructions.   The patient was advised to call back or seek an in-person evaluation if the symptoms worsen or if the condition fails to improve as anticipated.  Collaboration of Care: Other provider involved in patient's  care AEB notes are available in epic to review.  Patient/Guardian was advised Release of Information must be obtained prior to any record release in order to collaborate their care with an outside provider. Patient/Guardian was advised if they have not already done so to contact the registration department to sign all necessary forms in order for [place] to release information regarding their care.   Consent: Patient/Guardian gives verbal consent for treatment and assignment of benefits for services provided during this visit. Patient/Guardian expressed understanding and agreed to proceed.    I provided 27 minutes of non-face-to-face time during this encounter.   Cleotis Nipper, MD

## 2022-08-19 ENCOUNTER — Ambulatory Visit (INDEPENDENT_AMBULATORY_CARE_PROVIDER_SITE_OTHER): Payer: BC Managed Care – PPO | Admitting: Licensed Clinical Social Worker

## 2022-08-19 DIAGNOSIS — F330 Major depressive disorder, recurrent, mild: Secondary | ICD-10-CM | POA: Diagnosis not present

## 2022-08-19 DIAGNOSIS — Z630 Problems in relationship with spouse or partner: Secondary | ICD-10-CM

## 2022-08-19 DIAGNOSIS — F419 Anxiety disorder, unspecified: Secondary | ICD-10-CM | POA: Diagnosis not present
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
## 2022-09-16 ENCOUNTER — Ambulatory Visit (HOSPITAL_COMMUNITY): Payer: BC Managed Care – PPO | Admitting: Licensed Clinical Social Worker

## 2022-09-16 DIAGNOSIS — F330 Major depressive disorder, recurrent, mild: Secondary | ICD-10-CM

## 2022-09-16 DIAGNOSIS — F419 Anxiety disorder, unspecified: Secondary | ICD-10-CM

## 2022-09-16 DIAGNOSIS — Z630 Problems in relationship with spouse or partner: Secondary | ICD-10-CM | POA: Diagnosis not present
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
## 2022-09-28 ENCOUNTER — Telehealth (HOSPITAL_BASED_OUTPATIENT_CLINIC_OR_DEPARTMENT_OTHER): Payer: BC Managed Care – PPO | Admitting: Psychiatry

## 2022-09-28 ENCOUNTER — Encounter (HOSPITAL_COMMUNITY): Payer: Self-pay | Admitting: Psychiatry

## 2022-09-28 VITALS — Wt 140.0 lb

## 2022-09-28 DIAGNOSIS — F330 Major depressive disorder, recurrent, mild: Secondary | ICD-10-CM

## 2022-09-28 DIAGNOSIS — F419 Anxiety disorder, unspecified: Secondary | ICD-10-CM

## 2022-09-28 DIAGNOSIS — Z630 Problems in relationship with spouse or partner: Secondary | ICD-10-CM

## 2022-09-28 MED ORDER — GABAPENTIN 100 MG PO CAPS: ORAL_CAPSULE | ORAL | 2 refills | Status: DC

## 2022-09-28 MED ORDER — ARIPIPRAZOLE 2 MG PO TABS: 2.0000 mg | ORAL_TABLET | Freq: Every day | ORAL | 0 refills | Status: DC

## 2022-09-28 MED ORDER — LAMOTRIGINE 200 MG PO TABS: 200.0000 mg | ORAL_TABLET | Freq: Every day | ORAL | 0 refills | Status: DC

## 2022-09-28 MED ORDER — TRAZODONE HCL 150 MG PO TABS: 150.0000 mg | ORAL_TABLET | Freq: Every day | ORAL | 0 refills | Status: DC
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
## 2022-10-06 ENCOUNTER — Ambulatory Visit (HOSPITAL_COMMUNITY): Payer: BC Managed Care – PPO | Admitting: Licensed Clinical Social Worker

## 2022-10-06 DIAGNOSIS — F330 Major depressive disorder, recurrent, mild: Secondary | ICD-10-CM | POA: Diagnosis not present

## 2022-10-06 DIAGNOSIS — Z630 Problems in relationship with spouse or partner: Secondary | ICD-10-CM | POA: Diagnosis not present

## 2022-10-07 NOTE — Progress Notes (Signed)
Virtual Visit via Video Note  THERAPIST PROGRESS NOTE  Session Time: [place] in office visit for patient and LCSW clinician  Participation Level: Active  Behavioral Response: Neat and Well GroomedAlertAnxious  Type of Therapy: Individual Therapy  Treatment Goals addressed:LTG: Sheketa will attend and participate in therapeutic, recreational and educational activities that support interpersonal effectiveness  ProgressTowards Goals: Progressing  Interventions: CBT, Solution Focused, and Supportive  Summary: Jaime [name] is a 57 y.o. female who presents with continuing symptoms related to depression and anxiety diagnoses.   Allowed pt to explore and express thoughts and feelings associated with recent life situations and external stressors.  Patient reports that she is continuing to experience stress and anxiety associated with her marriage.  Patient reports that she feels that her husband is not following all of the promises that he originally made when they were separated.  Patient reports that her husband is continuing to ask her very detailed questions about intermittent experiences that she has had with other men.  Patient reports that it makes her feel very uncomfortable talking about this, and she has expressed it to her husband on multiple occasions, but he continues to put pressure on her.  Patient reports that there have been times where her husband has woken her up in the middle of the night to discuss things about her previous intimate encounters.  Patient reports that this is triggering anxiety, stress, and depression.  Patient feels helpless and recognizes that there is nothing that she can do to settle his thoughts down.  Patient states she has recommended that her husband get therapy or counseling, but he is not moving forward with the recommendations.  Patient reports tha she is hesitant to discuss this with her family members,  because her family members did not want her to originally get back together with him 6 months ago.  Allowed patient to identify strengths in the relationship, and not just weaknesses.  Encouraged patient to continue identifying strengths and weaknesses--and encouraged patient to explore how she can strengthen any weaknesses in the marriage.  Patient reports that her husband is continuing to maintain his sobriety.  Patient very tearful throughout session, and is appreciative of unconditional support that she gets in the therapeutic alliance.  Encouraged patient to not make any major life decisions while she is feeling overly emotional, and encouraged patient to use her rational mind to explore different options, and to speak with loved ones and family members that she trusts.  Encouraged patient to focus on self-care to assist in managing this marital crisis.  Continued recommendations are as follows: self care behaviors, positive social engagements, focusing on overall work/home/life balance, and focusing on positive physical and emotional wellness.   Suicidal/Homicidal: No  Therapist Response: Pt is continuing to apply interventions learned in session into daily life situations. Pt is currently on track to meet goals utilizing interventions mentioned above. Personal growth and progress noted. Treatment to continue as indicated.   Plan: Return again in 4 weeks.  Diagnosis:  Encounter Diagnoses  Name Primary?   MDD (major depressive disorder), recurrent episode, mild ([place]) Yes   Marital stress    Collaboration of Care: Other pt to continue care with psychiatrist of record, Dr. Berniece Andreas  Patient/Guardian was advised Release of Information must be obtained prior to any record release in order to collaborate their care with an outside provider. Patient/Guardian was advised if they have not already done so to contact the registration department to  sign all necessary forms in order for [place] to  release information regarding their care.   Consent: Patient/Guardian gives verbal consent for treatment and assignment of benefits for services provided during this visit. Patient/Guardian expressed understanding and agreed to proceed.   [place], LCSW 10/07/2022

## 2022-10-27 ENCOUNTER — Ambulatory Visit (HOSPITAL_COMMUNITY): Payer: BC Managed Care – PPO | Admitting: Licensed Clinical Social Worker

## 2022-10-27 DIAGNOSIS — Z630 Problems in relationship with spouse or partner: Secondary | ICD-10-CM | POA: Diagnosis not present

## 2022-10-27 DIAGNOSIS — F330 Major depressive disorder, recurrent, mild: Secondary | ICD-10-CM

## 2022-10-27 DIAGNOSIS — F419 Anxiety disorder, unspecified: Secondary | ICD-10-CM

## 2022-10-28 NOTE — Progress Notes (Signed)
  THERAPIST PROGRESS NOTE  Session Time: 4-5p  [place] in office visit for patient and LCSW clinician  Participation Level: Active  Behavioral Response: Neat and Well GroomedAlertAnxious  Type of Therapy: Individual Therapy  Treatment Goals addressed:LTG: Jetta will attend and participate in therapeutic, recreational and educational activities that support interpersonal effectiveness  ProgressTowards Goals: Progressing  Interventions: CBT, Solution Focused, and Supportive  Summary: Euline Mccory is a 57 y.o. female who presents with continuing symptoms related to depression and anxiety diagnoses.   Allowed pt to explore and express thoughts and feelings associated with recent life situations and external stressors.  Patient reports that she has worked very hard since last session to continue to set limits and boundaries with her husband and others.  Patient states that there have been improvements in overall communication skills, listening skills, and enjoying each other's company since last session.  Patient reports that her husband is maintaining his sobriety, and is committed to the interventions that they are trying for repairing marriage.  Patient states that her work is going well, and she feels that she has a good work life balance currently.  Assisted patient in identifying self-care behaviors that she is engaging in.  Allowed patient to identify coping skills that she has been using, and offered suggestions of additional coping skills that would be helpful for managing depression and anxiety symptoms.  Patient reflects understanding and willingness to cooperate.  Continued recommendations are as follows: self care behaviors, positive social engagements, focusing on overall work/home/life balance, and focusing on positive physical and emotional wellness.   Suicidal/Homicidal: No  Therapist Response: Pt is continuing to apply  interventions learned in session into daily life situations. Pt is currently on track to meet goals utilizing interventions mentioned above. Personal growth and progress noted. Treatment to continue as indicated.   Patient is continuing to develop significantly in the areas of family and relational functioning, improved stress associated with occupational functioning, and is demonstrating healthy boundaries from extended family members.  Patient is demonstrating and can articulate a greater awareness, understanding, and expression of underlying emotions.  Plan: Return again in 4 weeks.  Diagnosis:  Encounter Diagnoses  Name Primary?   MDD (major depressive disorder), recurrent episode, mild ([place]) Yes   Anxiety    Marital stress    Collaboration of Care: Other pt to continue care with psychiatrist of record, Dr. Berniece Andreas  Patient/Guardian was advised Release of Information must be obtained prior to any record release in order to collaborate their care with an outside provider. Patient/Guardian was advised if they have not already done so to contact the registration department to sign all necessary forms in order for [place] to release information regarding their care.   Consent: Patient/Guardian gives verbal consent for treatment and assignment of benefits for services provided during this visit. Patient/Guardian expressed understanding and agreed to proceed.   [place], LCSW 10/28/2022

## 2022-11-08 ENCOUNTER — Other Ambulatory Visit (HOSPITAL_COMMUNITY): Payer: Self-pay | Admitting: Psychiatry

## 2022-11-08 DIAGNOSIS — F419 Anxiety disorder, unspecified: Secondary | ICD-10-CM

## 2022-11-08 DIAGNOSIS — F330 Major depressive disorder, recurrent, mild: Secondary | ICD-10-CM

## 2022-11-10 ENCOUNTER — Ambulatory Visit (INDEPENDENT_AMBULATORY_CARE_PROVIDER_SITE_OTHER): Payer: BC Managed Care – PPO | Admitting: Licensed Clinical Social Worker

## 2022-11-10 ENCOUNTER — Ambulatory Visit (HOSPITAL_COMMUNITY): Payer: BC Managed Care – PPO | Admitting: Licensed Clinical Social Worker

## 2022-11-10 ENCOUNTER — Telehealth (HOSPITAL_COMMUNITY): Payer: Self-pay | Admitting: *Deleted

## 2022-11-10 DIAGNOSIS — F419 Anxiety disorder, unspecified: Secondary | ICD-10-CM | POA: Diagnosis not present

## 2022-11-10 DIAGNOSIS — Z630 Problems in relationship with spouse or partner: Secondary | ICD-10-CM | POA: Diagnosis not present

## 2022-11-10 DIAGNOSIS — F330 Major depressive disorder, recurrent, mild: Secondary | ICD-10-CM | POA: Diagnosis not present

## 2022-11-10 NOTE — Telephone Encounter (Signed)
Patient Requested Refill-- [facility], [place]   gabapentin (NEURONTIN) 100 MG capsule   Last visit:: 09/28/21 Next visit:; 12/28/22

## 2022-11-10 NOTE — Progress Notes (Signed)
THERAPIST PROGRESS NOTE  Session Time: [place] in office visit for patient and LCSW clinician  Participation Level: Active  Behavioral Response: Neat and Well GroomedAlertAnxious  Type of Therapy: Individual Therapy  Treatment Goals addressed:LTG: Kassie will attend and participate in therapeutic, recreational and educational activities that support interpersonal effectiveness  ProgressTowards Goals: Progressing  Interventions: CBT, Solution Focused, and Supportive  Summary: Khalila Miska is a 57 y.o. female who presents with continuing symptoms related to depression and anxiety diagnoses.   Patient reports that overall mood has been stable, and that she is managing situational stressors well.  Patient reports good quality and quantity of sleep.  Patient reports that her appetite is within normal limits.  Allowed pt to explore and express thoughts and feelings associated with recent life situations and external stressors.  Explored current relationship with husband.  Patient continues to feel that their communication skills have improved, and that they are being intentional about spending quality time together both at home and outside of home.  Patient reports that she has been engaging in her " happy place" which is working in the yard.  Patient states that she just feels better when she is outside.  Continued to encourage patient to engage in positive behavioral activities, such as working outside.  Patient states that she identifies a major anxiety trigger-patient states that sometimes she is not aware of what her husband's mood is going to be, and this triggers anxiety for her.  Allow patient to explore this deeper, and patient states that sometimes her husband comes home very angry, and this is when he will often make comments about her previous dating history.  Patient states that she often will feel defensive in that moment, and reacts  in different ways.  Allow patient to explore coping skills that she uses to manage her own symptoms of anxiety in the moment.  Patient states that deep breathing works well for her, and she often will use it during times of distress.  Reviewed couples communication skills, listening skills, importance of daily together activities, and reviewed 5 love languages.  Continued recommendations are as follows: self care behaviors, positive social engagements, focusing on overall work/home/life balance, and focusing on positive physical and emotional wellness.   Suicidal/Homicidal: No  Therapist Response: Pt is continuing to apply interventions learned in session into daily life situations. Pt is currently on track to meet goals utilizing interventions mentioned above. Personal growth and progress noted. Treatment to continue as indicated.   Patient is continuing to develop significantly in the areas of family and relational functioning, improved stress associated with occupational functioning, and is demonstrating healthy boundaries from extended family members.  Patient is demonstrating and can articulate a greater awareness, understanding, and expression of underlying emotions.  Plan: Return again in 4 weeks.  Diagnosis:  Encounter Diagnoses  Name Primary?   MDD (major depressive disorder), recurrent episode, mild ([place]) Yes   Anxiety    Marital stress    Collaboration of Care: Other pt to continue care with psychiatrist of record, Dr. Berniece Andreas  Patient/Guardian was advised Release of Information must be obtained prior to any record release in order to collaborate their care with an outside provider. Patient/Guardian was advised if they have not already done so to contact the registration department to sign all necessary forms in order for [place] to release information regarding their care.   Consent: Patient/Guardian gives verbal consent for treatment and assignment of benefits for services  provided during this visit. Patient/Guardian expressed understanding and agreed to proceed.   [place], LCSW 11/10/2022

## 2022-12-01 ENCOUNTER — Ambulatory Visit (INDEPENDENT_AMBULATORY_CARE_PROVIDER_SITE_OTHER): Payer: BC Managed Care – PPO | Admitting: Licensed Clinical Social Worker

## 2022-12-01 DIAGNOSIS — Z630 Problems in relationship with spouse or partner: Secondary | ICD-10-CM

## 2022-12-01 DIAGNOSIS — F330 Major depressive disorder, recurrent, mild: Secondary | ICD-10-CM | POA: Diagnosis not present

## 2022-12-01 DIAGNOSIS — F419 Anxiety disorder, unspecified: Secondary | ICD-10-CM | POA: Diagnosis not present

## 2022-12-02 NOTE — Progress Notes (Signed)
  THERAPIST PROGRESS NOTE  Session Time: 4-5p  [place] in office visit for patient and LCSW clinician  Participation Level: Active  Behavioral Response: Neat and Well GroomedAlertAnxious  Type of Therapy: Individual Therapy  Treatment Goals addressed:LTG: Tyshay will attend and participate in therapeutic, recreational and educational activities that support interpersonal effectiveness  ProgressTowards Goals: Progressing  Interventions: CBT, Solution Focused, and Supportive  Summary: Jaime [name] is a 57 y.o. female who presents with continuing symptoms related to depression and anxiety diagnoses.   Patient reports that overall mood has been stable, and that she is managing situational stressors well.  Patient reports good quality and quantity of sleep.  Patient reports that her appetite is within normal limits.  Allowed pt to explore and express thoughts and feelings associated with recent life situations and external stressors.  Patient reports that she is continuing to experience stress associated with her marriage.  Patient reports that her husband will continue to insinuate things about her past.  Patient reports that she is tired of telling him to not talk about her past over and over.  Patient states that she feels disrespected by her husband when he continues to discuss this after patient has asked him multiple times not to.  Patient reports that she feels at peace when she is engaging in church related activities.  Continued to encourage patient to explore present state of relationship, encouraged patient to identify the role that she is playing within the context of the relationship, and ways that both she and her husband can change behaviors to improve compromise within the relationship.  Allowed patient to identify relationship strengths.  Continued recommendations are as follows: self care behaviors, positive social engagements,  focusing on overall work/home/life balance, and focusing on positive physical and emotional wellness.   Suicidal/Homicidal: No  Therapist Response: Pt is continuing to apply interventions learned in session into daily life situations. Pt is currently on track to meet goals utilizing interventions mentioned above. Personal growth and progress noted. Treatment to continue as indicated.   Patient is continuing to develop significantly in the areas of family and relational functioning, improved stress associated with occupational functioning, and is demonstrating healthy boundaries from extended family members.  Patient is demonstrating and can articulate a greater awareness, understanding, and expression of underlying emotions.  Plan: Return again in 4 weeks.  Diagnosis:  Encounter Diagnoses  Name Primary?   MDD (major depressive disorder), recurrent episode, mild Yes   Anxiety    Marital stress     Collaboration of Care: Other pt to continue care with psychiatrist of record, Dr. Berniece Andreas  Patient/Guardian was advised Release of Information must be obtained prior to any record release in order to collaborate their care with an outside provider. Patient/Guardian was advised if they have not already done so to contact the registration department to sign all necessary forms in order for [place] to release information regarding their care.   Consent: Patient/Guardian gives verbal consent for treatment and assignment of benefits for services provided during this visit. Patient/Guardian expressed understanding and agreed to proceed.   [place], LCSW 12/02/2022

## 2022-12-13 ENCOUNTER — Other Ambulatory Visit (HOSPITAL_COMMUNITY): Payer: Self-pay | Admitting: Psychiatry

## 2022-12-13 DIAGNOSIS — F330 Major depressive disorder, recurrent, mild: Secondary | ICD-10-CM

## 2022-12-22 ENCOUNTER — Ambulatory Visit (HOSPITAL_COMMUNITY): Payer: BC Managed Care – PPO | Admitting: Licensed Clinical Social Worker

## 2022-12-28 ENCOUNTER — Encounter (HOSPITAL_COMMUNITY): Payer: Self-pay | Admitting: Psychiatry

## 2022-12-28 ENCOUNTER — Telehealth (HOSPITAL_BASED_OUTPATIENT_CLINIC_OR_DEPARTMENT_OTHER): Payer: BC Managed Care – PPO | Admitting: Psychiatry

## 2022-12-28 DIAGNOSIS — F330 Major depressive disorder, recurrent, mild: Secondary | ICD-10-CM

## 2022-12-28 DIAGNOSIS — F419 Anxiety disorder, unspecified: Secondary | ICD-10-CM

## 2022-12-28 MED ORDER — ARIPIPRAZOLE 2 MG PO TABS: 2.0000 mg | ORAL_TABLET | Freq: Every day | ORAL | 0 refills | Status: DC

## 2022-12-28 MED ORDER — LAMOTRIGINE 200 MG PO TABS: 200.0000 mg | ORAL_TABLET | Freq: Every day | ORAL | 0 refills | Status: DC

## 2022-12-28 MED ORDER — GABAPENTIN 100 MG PO CAPS: ORAL_CAPSULE | ORAL | 2 refills | Status: DC

## 2022-12-28 MED ORDER — TRAZODONE HCL 150 MG PO TABS: 150.0000 mg | ORAL_TABLET | Freq: Every day | ORAL | 0 refills | Status: DC

## 2022-12-28 NOTE — Progress Notes (Signed)
[facility] MD Virtual Progress Note   Patient Location: Home Provider Location: Home Office  I connect with patient by video and verified that I am speaking with correct person by using two identifiers. I discussed the limitations of evaluation and management by telemedicine and the availability of in person appointments. I also discussed with the patient that there may be a patient responsible charge related to this service. The patient expressed understanding and agreed to proceed.  Jaime [name] [id number] 57 y.o.  12/28/2022 3:23 PM  History of Present Illness:  Patient is evaluated by video session.  She is doing better on Abilify, gabapentin, trazodone and Lamictal.  She does not get upset or irritable and feel like medicine working.  She is working only 1 house a day and then states she is off.  She admitted sometimes taking a nap during the day and for that reason sometimes she does not sleep good at bedtime.  Overall she reported her irritability, anger is better.  She feels her relationship with her husband is much better and there has been no recent argument agitation issues.  She is in therapy with Christina.  Patient is no longer taking doxepin.  She is thinking that therapy and medicine keeping her mood stable.  She denies any panic attack, crying spells or any feeling of hopelessness or worthlessness.  Past Psychiatric History: H/O depression and anxiety. Tried Wellbutrin, Prozac, Zoloft, amitriptyline, Lexapro and doxepin.  No h/o mania, psychosis, hallucination, suicidal attempt or inpatient.     Outpatient Encounter Medications as of 12/28/2022  Medication Sig   alendronate (FOSAMAX) 70 MG tablet Fosamax 70 mg tablet  Take 1 tablet every week by oral route.   ARIPiprazole (ABILIFY) 2 MG tablet Take 1 tablet (2 mg total) by mouth daily.   calcium carbonate (CALCIUM 600) 1500 (600 Ca) MG TABS tablet 1 tablet with meals   doxepin (SINEQUAN) 10 MG  capsule Take 2 capsules (20 mg total) by mouth at bedtime. (Patient not taking: Reported on 08/16/2022)   estradiol (ESTRACE) 0.1 MG/GM vaginal cream estradiol 0.01% (0.1 mg/gram) vaginal cream  Insert 1 g twice a week by vaginal route.   gabapentin (NEURONTIN) 100 MG capsule Take one capsule at 6 am, 10 am, 2 pm, 6 pm and one at bedtime.   lamoTRIgine (LAMICTAL) 200 MG tablet Take 1 tablet (200 mg total) by mouth daily.   Multiple Vitamin (MULTIVITAMIN) tablet Take 1 tablet by mouth daily.   Omega-3 Fatty Acids (FISH OIL PO) Take by mouth daily.   traZODone (DESYREL) 150 MG tablet Take 1 tablet (150 mg total) by mouth at bedtime. Take one tab at bed time   No facility-administered encounter medications on file as of 12/28/2022.    No results found for this or any previous visit (from the past 2160 hour(s)).   Psychiatric Specialty Exam: Physical Exam  Review of Systems  Weight 145 lb (65.8 kg), last menstrual period 11/28/2012.There is no height or weight on file to calculate BMI.  General Appearance: Casual  Eye Contact:  Good  Speech:  Clear and Coherent  Volume:  Normal  Mood:  Euthymic  Affect:  Congruent  Thought Process:  Goal Directed  Orientation:  Full (Time, Place, and Person)  Thought Content:  Logical  Suicidal Thoughts:  No  Homicidal Thoughts:  No  Memory:  Immediate;   Good Recent;   Good Remote;   Good  Judgement:  Good  Insight:  Good  Psychomotor Activity:  Normal  Concentration:  Concentration: Good and Attention Span: Good  Recall:  Good  Fund of Knowledge:  Good  Language:  Good  Akathisia:  No  Handed:  Right  AIMS (if indicated):     Assets:  Communication Skills Desire for Improvement Housing Resilience Social Support Talents/Skills Transportation  ADL's:  Intact  Cognition:  WNL  Sleep:  sometimes sleep during day     Assessment/Plan: MDD (major depressive disorder), recurrent episode, mild ([facility]) - Plan: lamoTRIgine (LAMICTAL) 200 MG  tablet, traZODone (DESYREL) 150 MG tablet, ARIPiprazole (ABILIFY) 2 MG tablet, gabapentin (NEURONTIN) 100 MG capsule  Anxiety - Plan: gabapentin (NEURONTIN) 100 MG capsule  I reviewed psychosocial stressors and medication.  Patient is stable with the combination of medication and therapy.  She does not want to change the dose.  Encouraged to keep therapy with Trula Ore and discussed sleep hygiene to avoid taking naps during the day.  Patient agree with the plan.  Continue Abilify 2 mg daily, Lamictal 200 mg daily and trazodone 150 mg at bedtime and gabapentin 100 mg 5 times a day.  Recommend to call us back if he has any question or any concern.  Follow-up in 3 months.   Follow Up Instructions:     I discussed the assessment and treatment plan with the patient. The patient was provided an opportunity to ask questions and all were answered. The patient agreed with the plan and demonstrated an understanding of the instructions.   The patient was advised to call back or seek an in-person evaluation if the symptoms worsen or if the condition fails to improve as anticipated.    Collaboration of Care: Other provider involved in patient's care AEB notes are available in epic to review.  Patient/Guardian was advised Release of Information must be obtained prior to any record release in order to collaborate their care with an outside provider. Patient/Guardian was advised if they have not already done so to contact the registration department to sign all necessary forms in order for [place] to release information regarding their care.   Consent: Patient/Guardian gives verbal consent for treatment and assignment of benefits for services provided during this visit. Patient/Guardian expressed understanding and agreed to proceed.     I provided 20 minutes of non face to face time during this encounter.  Note: This document was prepared by [employer] voice dictation technology and any errors that results from this  process are unintentional.    Cleotis Nipper, MD 12/28/2022

## 2023-01-12 ENCOUNTER — Ambulatory Visit (HOSPITAL_COMMUNITY): Payer: BC Managed Care – PPO | Admitting: Licensed Clinical Social Worker

## 2023-01-12 DIAGNOSIS — Z630 Problems in relationship with spouse or partner: Secondary | ICD-10-CM

## 2023-01-12 DIAGNOSIS — F330 Major depressive disorder, recurrent, mild: Secondary | ICD-10-CM

## 2023-01-12 DIAGNOSIS — F419 Anxiety disorder, unspecified: Secondary | ICD-10-CM | POA: Diagnosis not present
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
## 2023-02-10 ENCOUNTER — Ambulatory Visit (HOSPITAL_COMMUNITY): Payer: BC Managed Care – PPO | Admitting: Licensed Clinical Social Worker

## 2023-02-10 DIAGNOSIS — F419 Anxiety disorder, unspecified: Secondary | ICD-10-CM

## 2023-02-10 DIAGNOSIS — F330 Major depressive disorder, recurrent, mild: Secondary | ICD-10-CM

## 2023-02-11 NOTE — Progress Notes (Signed)
  THERAPIST PROGRESS NOTE  Session Time: 4-5p  Behavioral Health Outpatient [facility] in office visit for patient and LCSW clinician  Participation Level: Active  Behavioral Response: Neat and Well GroomedAlertAnxious  Type of Therapy: Individual Therapy  Treatment Goals addressed:LTG: Jaime will attend and participate in therapeutic, recreational and educational activities that support interpersonal effectiveness  ProgressTowards Goals: Progressing  Interventions: CBT, Solution Focused, and Supportive  Summary: Jaime [name] is a 57 y.o. female who presents with improving symptoms related to depression and anxiety diagnoses.   Pt reports that overall mood is stable and that she is managing situational stressors well.   Allowed pt to explore current relationship concerns. Discussed pt point of view, partner's point of view, and discussed areas of compromise/understanding. Pt feels her husband is continuing with his sobriety journey but still questions pt at times "I think he is really insecure". Explored current communication skills that both parties are using and discussed ways/alternatives to improve communication. Discussed healthy conflict resolution. Pt reports that overall communication has improved with the two of them since their separation/reconciliation. Pt states that July is when they separated last year--her husband has made some comments about everything that happened prior to the separation and awareness of current relationship.  Pt reports that she feels more empowered in the relationship--pt is understanding her "dealbreakers" regarding relationship concerns. Pt states that sobriety and fidelity/loyalty are very important to her and if he starts drinking again or were to cheat, pt does not feel that she could continue the relationship.  Reviewed importance of pt continuing with self care and focus on positive life balance. Allowed pt to identify coping skills  that she uses on days when she feels most depressed, and reviewed mindful activities that assist when pt is feeling high anxiety.  Continued recommendations are as follows: self care behaviors, positive social engagements, focusing on overall work/home/life balance, and focusing on positive physical and emotional wellness.   Suicidal/Homicidal: No  Therapist Response: Pt is continuing to apply interventions learned in session into daily life situations. Pt is currently on track to meet goals utilizing interventions mentioned above. Personal growth and progress noted. Treatment to continue as indicated.   Patient is continuing to develop significantly in the areas of family and relational functioning, improved stress associated with occupational functioning, and is demonstrating healthy boundaries from extended family members.  Patient is demonstrating and can articulate a greater awareness, understanding, and expression of underlying emotions.  Plan: Return again in 4 weeks.  Diagnosis:  Encounter Diagnoses  Name Primary?   MDD (major depressive disorder), recurrent episode, mild (HCC) Yes   Anxiety     Collaboration of Care: Other pt to continue care with psychiatrist of record, Dr. Kathryne Sharper  Patient/Guardian was advised Release of Information must be obtained prior to any record release in order to collaborate their care with an outside provider. Patient/Guardian was advised if they have not already done so to contact the registration department to sign all necessary forms in order for [place] to release information regarding their care.   Consent: Patient/Guardian gives verbal consent for treatment and assignment of benefits for services provided during this visit. Patient/Guardian expressed understanding and agreed to proceed.   Jaime [name] Jaime Foresta, LCSW 02/11/2023

## 2023-03-16 ENCOUNTER — Ambulatory Visit (HOSPITAL_COMMUNITY): Payer: BC Managed Care – PPO | Admitting: Licensed Clinical Social Worker

## 2023-03-16 DIAGNOSIS — F419 Anxiety disorder, unspecified: Secondary | ICD-10-CM | POA: Diagnosis not present

## 2023-03-16 DIAGNOSIS — F330 Major depressive disorder, recurrent, mild: Secondary | ICD-10-CM | POA: Diagnosis not present

## 2023-03-17 NOTE — Progress Notes (Signed)
  THERAPIST PROGRESS NOTE  Session Time: 4-5p  Behavioral Health Outpatient [facility] in office visit for patient and LCSW clinician  Participation Level: Active  Behavioral Response: Neat and Well GroomedAlertAnxious  Type of Therapy: Individual Therapy  Treatment Goals addressed:LTG: Nakeshia will attend and participate in therapeutic, recreational and educational activities that support interpersonal effectiveness  ProgressTowards Goals: Progressing  Interventions: CBT, Solution Focused, and Supportive  Summary: Jaime [name] is a 57 y.o. female who presents with improving symptoms related to depression and anxiety diagnoses.   Pt reports that overall mood is stable and that she is managing situational stressors well.   Assisted pt with identifying anxiety triggers. Discussed importance of pushing through the avoidance response and to use coping skills to manage any feelings of minor distress. Allowed pt to explore and identify any limits or boundaries that could be helpful with managing stress/anxiety triggers.   Allowed pt to explore current relationship concerns. Discussed pt point of view, partner's point of view, and discussed areas of compromise/understanding. Pt reports that she feels communication has improved significantly between herself and her husband. Reviewed healthy conflict resolution.   Explored patients overall focus on wellness. Discussed emotional wellness and included discussions about improving overall work/life balance and improvements in self care. Pt feels she has made positive changes and feels better since making the changes. Discussed physical wellness and included discussions about improving physical activity (at pts ability level). Allowed pt to explore what changes and improvements that they feel would be good personal goals without feeling too overwhelmed. Pt committed to positive change.  Continued recommendations are as follows: self care  behaviors, positive social engagements, focusing on overall work/home/life balance, and focusing on positive physical and emotional wellness.   Suicidal/Homicidal: No  Therapist Response: Pt is continuing to apply interventions learned in session into daily life situations. Pt is currently on track to meet goals utilizing interventions mentioned above. Personal growth and progress noted. Treatment to continue as indicated.   Patient is continuing to develop significantly in the areas of family and relational functioning, improved stress associated with occupational functioning, and is demonstrating healthy boundaries from extended family members.  Patient is demonstrating and can articulate a greater awareness, understanding, and expression of underlying emotions.  Plan: Informed patient that clinician will be leaving outpatient department. Allowed pt to explore any questions or concerns and discussed future counseling options/resources. Provided pt with psychoeducational resources and list of OPT therapists. Encouraged pt to continue with psychiatric med management appointments, if applicable.   Diagnosis:  Encounter Diagnoses  Name Primary?   MDD (major depressive disorder), recurrent episode, mild (HCC) Yes   Anxiety    Collaboration of Care: Other pt to continue care with psychiatrist of record, Dr. Kathryne Sharper  Patient/Guardian was advised Release of Information must be obtained prior to any record release in order to collaborate their care with an outside provider. Patient/Guardian was advised if they have not already done so to contact the registration department to sign all necessary forms in order for [place] to release information regarding their care.   Consent: Patient/Guardian gives verbal consent for treatment and assignment of benefits for services provided during this visit. Patient/Guardian expressed understanding and agreed to proceed.   Ernest Haber Milbert Bixler, LCSW 03/17/2023

## 2023-03-29 ENCOUNTER — Encounter (HOSPITAL_COMMUNITY): Payer: Self-pay | Admitting: Psychiatry

## 2023-03-29 ENCOUNTER — Telehealth (HOSPITAL_BASED_OUTPATIENT_CLINIC_OR_DEPARTMENT_OTHER): Payer: BC Managed Care – PPO | Admitting: Psychiatry

## 2023-03-29 VITALS — Wt 145.0 lb

## 2023-03-29 DIAGNOSIS — F330 Major depressive disorder, recurrent, mild: Secondary | ICD-10-CM

## 2023-03-29 DIAGNOSIS — F419 Anxiety disorder, unspecified: Secondary | ICD-10-CM | POA: Diagnosis not present

## 2023-03-29 MED ORDER — GABAPENTIN 100 MG PO CAPS: ORAL_CAPSULE | ORAL | 2 refills | Status: DC

## 2023-03-29 MED ORDER — LAMOTRIGINE 200 MG PO TABS: 200.0000 mg | ORAL_TABLET | Freq: Every day | ORAL | 0 refills | Status: DC

## 2023-03-29 MED ORDER — ARIPIPRAZOLE 2 MG PO TABS: 2.0000 mg | ORAL_TABLET | Freq: Every day | ORAL | 0 refills | Status: DC

## 2023-03-29 MED ORDER — TRAZODONE HCL 150 MG PO TABS: 150.0000 mg | ORAL_TABLET | Freq: Every day | ORAL | 0 refills | Status: DC

## 2023-03-29 NOTE — Progress Notes (Signed)
Health MD Virtual Progress Note   Patient Location: Home Provider Location: Home Office  I connect with patient by video and verified that I am speaking with correct person by using two identifiers. I discussed the limitations of evaluation and management by telemedicine and the availability of in person appointments. I also discussed with the patient that there may be a patient responsible charge related to this service. The patient expressed understanding and agreed to proceed.  Jaime [name] [id number] 57 y.o.  03/29/2023 4:00 PM  History of Present Illness:  Patient is evaluated by video session.  She is compliant with medication and things are going very well.  She is as therapist left but she feels good and does not need therapy anymore.  She is having her anxiety and depression much better.  Her relationship with the husband is better and even though they have argument but no major issues.  Patient told husband is working more and that has been helpful.  She denies any crying spells or any feeling of hopelessness.  Her sleep is good.  Her appetite is okay.  She has appointment coming up in October to see her primary care.  She has no rash, itching, tremors or shakes.  She like to keep the current medicine however we discussed in the future may cut down some of the medication if she continues to do better.  Past Psychiatric History: H/O depression and anxiety. Tried Wellbutrin, Prozac, Zoloft, amitriptyline, Lexapro and doxepin.  No h/o mania, psychosis, hallucination, suicidal attempt or inpatient.     Outpatient Encounter Medications as of 03/29/2023  Medication Sig   alendronate (FOSAMAX) 70 MG tablet Fosamax 70 mg tablet  Take 1 tablet every week by oral route.   ARIPiprazole (ABILIFY) 2 MG tablet Take 1 tablet (2 mg total) by mouth daily.   calcium carbonate (CALCIUM 600) 1500 (600 Ca) MG TABS tablet 1 tablet with meals   doxepin (SINEQUAN) 10 MG  capsule Take 2 capsules (20 mg total) by mouth at bedtime. (Patient not taking: Reported on 08/16/2022)   estradiol (ESTRACE) 0.1 MG/GM vaginal cream estradiol 0.01% (0.1 mg/gram) vaginal cream  Insert 1 g twice a week by vaginal route.   gabapentin (NEURONTIN) 100 MG capsule Take one capsule at 6 am, 10 am, 2 pm, 6 pm and one at bedtime.   lamoTRIgine (LAMICTAL) 200 MG tablet Take 1 tablet (200 mg total) by mouth daily.   Multiple Vitamin (MULTIVITAMIN) tablet Take 1 tablet by mouth daily.   Omega-3 Fatty Acids (FISH OIL PO) Take by mouth daily.   traZODone (DESYREL) 150 MG tablet Take 1 tablet (150 mg total) by mouth at bedtime. Take one tab at bed time   No facility-administered encounter medications on file as of 03/29/2023.    No results found for this or any previous visit (from the past 2160 hour(s)).   Psychiatric Specialty Exam: Physical Exam  Review of Systems  Weight 145 lb (65.8 kg), last menstrual period 11/28/2012.There is no height or weight on file to calculate BMI.  General Appearance: Casual  Eye Contact:  Good  Speech:  Clear and Coherent and Normal Rate  Volume:  Normal  Mood:  Euthymic  Affect:  Appropriate  Thought Process:  Goal Directed  Orientation:  Full (Time, Place, and Person)  Thought Content:  WDL  Suicidal Thoughts:  No  Homicidal Thoughts:  No  Memory:  Immediate;   Good Recent;   Good Remote;   Good  Judgement:  Good  Insight:  Good  Psychomotor Activity:  Normal  Concentration:  Concentration: Good and Attention Span: Good  Recall:  Good  Fund of Knowledge:  Good  Language:  Good  Akathisia:  No  Handed:  Right  AIMS (if indicated):     Assets:  Communication Skills Desire for Improvement Housing Resilience Social Support Talents/Skills  ADL's:  Intact  Cognition:  WNL  Sleep:  ok     Assessment/Plan: MDD (major depressive disorder), recurrent episode, mild (HCC) - Plan: ARIPiprazole (ABILIFY) 2 MG tablet, gabapentin (NEURONTIN)  100 MG capsule, traZODone (DESYREL) 150 MG tablet, lamoTRIgine (LAMICTAL) 200 MG tablet  Anxiety - Plan: gabapentin (NEURONTIN) 100 MG capsule  Patient is stable on current medication.  She is no longer in therapy with Trula Ore after her therapist left the practice.  However patient does not feel she needed therapy anymore as things are much better.  We talk about polypharmacy and if patient remains stable we may consider in the future cutting down the medication.  For now continue Lamictal 200 mg daily, trazodone 150 mg at bedtime, gabapentin 100 mg 5 times a day and Abilify 2 mg daily.  Recommended to call us back if she has any question or any concern.  Follow-up in 3 months   Follow Up Instructions:     I discussed the assessment and treatment plan with the patient. The patient was provided an opportunity to ask questions and all were answered. The patient agreed with the plan and demonstrated an understanding of the instructions.   The patient was advised to call back or seek an in-person evaluation if the symptoms worsen or if the condition fails to improve as anticipated.    Collaboration of Care: Other provider involved in patient's care AEB notes are available in epic to review.  Patient/Guardian was advised Release of Information must be obtained prior to any record release in order to collaborate their care with an outside provider. Patient/Guardian was advised if they have not already done so to contact the registration department to sign all necessary forms in order for [place] to release information regarding their care.   Consent: Patient/Guardian gives verbal consent for treatment and assignment of benefits for services provided during this visit. Patient/Guardian expressed understanding and agreed to proceed.     I provided 15 minutes of non face to face time during this encounter.  Note: This document was prepared by [employer] voice dictation technology and any errors that results  from this process are unintentional.    Cleotis Nipper, MD 03/29/2023

## 2023-04-07 ENCOUNTER — Ambulatory Visit (HOSPITAL_COMMUNITY): Payer: BC Managed Care – PPO | Admitting: Licensed Clinical Social Worker

## 2023-06-13 ENCOUNTER — Other Ambulatory Visit (HOSPITAL_COMMUNITY): Payer: Self-pay | Admitting: Psychiatry

## 2023-06-13 DIAGNOSIS — F330 Major depressive disorder, recurrent, mild: Secondary | ICD-10-CM

## 2023-06-29 ENCOUNTER — Encounter (HOSPITAL_COMMUNITY): Payer: Self-pay | Admitting: Psychiatry

## 2023-06-29 ENCOUNTER — Telehealth (HOSPITAL_COMMUNITY): Payer: BC Managed Care – PPO | Admitting: Psychiatry

## 2023-06-29 VITALS — Wt 141.0 lb

## 2023-06-29 DIAGNOSIS — F330 Major depressive disorder, recurrent, mild: Secondary | ICD-10-CM

## 2023-06-29 DIAGNOSIS — Z630 Problems in relationship with spouse or partner: Secondary | ICD-10-CM

## 2023-06-29 DIAGNOSIS — F419 Anxiety disorder, unspecified: Secondary | ICD-10-CM | POA: Diagnosis not present

## 2023-06-29 MED ORDER — TRAZODONE HCL 100 MG PO TABS: 100.0000 mg | ORAL_TABLET | Freq: Every day | ORAL | 0 refills | Status: DC

## 2023-06-29 MED ORDER — GABAPENTIN 100 MG PO CAPS: ORAL_CAPSULE | ORAL | 2 refills | Status: DC

## 2023-06-29 MED ORDER — LAMOTRIGINE 200 MG PO TABS: 200.0000 mg | ORAL_TABLET | Freq: Every day | ORAL | 0 refills | Status: DC

## 2023-06-29 MED ORDER — ARIPIPRAZOLE 2 MG PO TABS: 2.0000 mg | ORAL_TABLET | Freq: Every day | ORAL | 0 refills | Status: DC
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
## 2023-09-01 ENCOUNTER — Other Ambulatory Visit (HOSPITAL_COMMUNITY): Payer: Self-pay | Admitting: Psychiatry

## 2023-09-01 DIAGNOSIS — F330 Major depressive disorder, recurrent, mild: Secondary | ICD-10-CM

## 2023-09-01 DIAGNOSIS — Z630 Problems in relationship with spouse or partner: Secondary | ICD-10-CM

## 2023-09-08 ENCOUNTER — Ambulatory Visit (INDEPENDENT_AMBULATORY_CARE_PROVIDER_SITE_OTHER): Payer: BC Managed Care – PPO | Admitting: Clinical

## 2023-09-08 ENCOUNTER — Encounter (HOSPITAL_COMMUNITY): Payer: Self-pay | Admitting: Clinical

## 2023-09-08 DIAGNOSIS — F331 Major depressive disorder, recurrent, moderate: Secondary | ICD-10-CM | POA: Diagnosis not present

## 2023-09-08 DIAGNOSIS — Z630 Problems in relationship with spouse or partner: Secondary | ICD-10-CM | POA: Diagnosis not present

## 2023-09-08 DIAGNOSIS — F419 Anxiety disorder, unspecified: Secondary | ICD-10-CM | POA: Diagnosis not present
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
## 2023-09-29 ENCOUNTER — Telehealth (HOSPITAL_COMMUNITY): Payer: BC Managed Care – PPO | Admitting: Psychiatry

## 2023-09-29 ENCOUNTER — Encounter (HOSPITAL_COMMUNITY): Payer: Self-pay | Admitting: Psychiatry

## 2023-09-29 DIAGNOSIS — Z630 Problems in relationship with spouse or partner: Secondary | ICD-10-CM

## 2023-09-29 DIAGNOSIS — F330 Major depressive disorder, recurrent, mild: Secondary | ICD-10-CM

## 2023-09-29 DIAGNOSIS — F419 Anxiety disorder, unspecified: Secondary | ICD-10-CM

## 2023-09-29 MED ORDER — TRAZODONE HCL 100 MG PO TABS: 100.0000 mg | ORAL_TABLET | Freq: Every day | ORAL | 0 refills | Status: DC

## 2023-09-29 MED ORDER — ARIPIPRAZOLE 5 MG PO TABS: 5.0000 mg | ORAL_TABLET | Freq: Every day | ORAL | 1 refills | Status: DC

## 2023-09-29 MED ORDER — GABAPENTIN 100 MG PO CAPS: 100.0000 mg | ORAL_CAPSULE | Freq: Three times a day (TID) | ORAL | 0 refills | Status: DC

## 2023-09-29 MED ORDER — LAMOTRIGINE 200 MG PO TABS: 200.0000 mg | ORAL_TABLET | Freq: Every day | ORAL | 0 refills | Status: DC

## 2023-09-29 NOTE — Progress Notes (Signed)
[facility] MD Virtual Progress Note   Patient Location: Home Provider Location: Office  I connect with patient by telephone and verified that I am speaking with correct person by using two identifiers. I discussed the limitations of evaluation and management by telemedicine and the availability of in person appointments. I also discussed with the patient that there may be a patient responsible charge related to this service. The patient expressed understanding and agreed to proceed.  Jaime [name] [id number] 58 y.o.  09/29/2023 1:45 PM  History of Present Illness:  Patient is evaluated by phone session.  Her video camera is not working.  She reported not doing very well as she feels tired, unmotivated to do things.  Her Christmas was fine.  She is concerned because her husband is without a job and she admitted some financial strain.  She admitted cannot afford seeing therapist every 2 weeks but like to keep the appointment with his end of the February.  She is now seeing [name].  She admitted feeling very tired, sleepy all day.  She is working but there are days when she has no desire to come out of her home.  She has no tremors or shakes or any EPS.  During the session she started to cry because she feels very overwhelmed.  She denies any suicidal thoughts or homicidal thoughts.  She denies any aggression, violence, paranoia but admitted some time negative thoughts.  She wants to get better.  She reported her marriage is going very well and there has no new issues in the marriage.  Currently she is taking gabapentin 100 mg 5 times a day, Lamictal 200 mg daily, Abilify 2 mg daily and trazodone 100 mg at bedtime which was reduced because she was sleeping too much.  She still feel too much sleep and having a hard time waking up in the morning.  Her appetite is okay.  Her weight is stable.  She had a visit to her primary care in October and labs are normal.  Past  Psychiatric History: H/O depression and anxiety. Tried Wellbutrin, Prozac, Zoloft, amitriptyline, Lexapro and doxepin.  No h/o mania, psychosis, hallucination, suicidal attempt or inpatient.    Outpatient Encounter Medications as of 09/29/2023  Medication Sig   alendronate (FOSAMAX) 70 MG tablet Fosamax 70 mg tablet  Take 1 tablet every week by oral route.   ARIPiprazole (ABILIFY) 2 MG tablet Take 1 tablet (2 mg total) by mouth daily.   calcium carbonate (CALCIUM 600) 1500 (600 Ca) MG TABS tablet 1 tablet with meals   estradiol (ESTRACE) 0.1 MG/GM vaginal cream estradiol 0.01% (0.1 mg/gram) vaginal cream  Insert 1 g twice a week by vaginal route.   gabapentin (NEURONTIN) 100 MG capsule Take one capsule at 6 am, 10 am, 2 pm, 6 pm and one at bedtime.   lamoTRIgine (LAMICTAL) 200 MG tablet Take 1 tablet (200 mg total) by mouth daily.   Multiple Vitamin (MULTIVITAMIN) tablet Take 1 tablet by mouth daily.   Omega-3 Fatty Acids (FISH OIL PO) Take by mouth daily.   traZODone (DESYREL) 100 MG tablet Take 1 tablet (100 mg total) by mouth at bedtime.   No facility-administered encounter medications on file as of 09/29/2023.    No results found for this or any previous visit (from the past 2160 hours).   Psychiatric Specialty Exam: Physical Exam  Review of Systems  Weight 141 lb (64 kg), last menstrual period 11/28/2012.There is no height or weight on file to  calculate BMI.  General Appearance: NA  Eye Contact:  NA  Speech:  Slow  Volume:  Decreased  Mood:  Anxious and Depressed  Affect:  Constricted  Thought Process:  Descriptions of Associations: Intact  Orientation:  Full (Time, Place, and Person)  Thought Content:  Rumination  Suicidal Thoughts:  No  Homicidal Thoughts:  No  Memory:  Immediate;   Good Recent;   Good Remote;   Fair  Judgement:  Intact  Insight:  Present  Psychomotor Activity:  Decreased  Concentration:  Concentration: Fair and Attention Span: Fair  Recall:  Good   Fund of Knowledge:  Good  Language:  Good  Akathisia:  No  Handed:  Right  AIMS (if indicated):     Assets:  Communication Skills Desire for Improvement Housing Transportation  ADL's:  Intact  Cognition:  WNL  Sleep:  too sleepy     Assessment/Plan: MDD (major depressive disorder), recurrent episode, mild (HCC) - Plan: gabapentin (NEURONTIN) 100 MG capsule, lamoTRIgine (LAMICTAL) 200 MG tablet, traZODone (DESYREL) 100 MG tablet, ARIPiprazole (ABILIFY) 5 MG tablet  Anxiety - Plan: gabapentin (NEURONTIN) 100 MG capsule  Marital stress - Plan: lamoTRIgine (LAMICTAL) 200 MG tablet  Reviewed blood work results which was done in October at her primary care physician.  Discussed current medication and her clinical symptoms.  Recommend to cut down the gabapentin which she believes making her tired.  Currently she is taking 100 mg 5 times a day.  Recommend to take 100 mg 3 times a day.  We will increase Abilify from 2 mg to 5 mg to see if that helps.  Continue current dose of Lamictal 200 mg and trazodone 1 mg at bedtime.  She will see [name] end of next month.  She denies any active suicidal thoughts and agreed to give [place] a call back sooner if she needed an earlier appointment otherwise follow-up in 2 months.   Follow Up Instructions:     I discussed the assessment and treatment plan with the patient. The patient was provided an opportunity to ask questions and all were answered. The patient agreed with the plan and demonstrated an understanding of the instructions.   The patient was advised to call back or seek an in-person evaluation if the symptoms worsen or if the condition fails to improve as anticipated.    Collaboration of Care: Other provider involved in patient's care AEB notes are available in epic to review  Patient/Guardian was advised Release of Information must be obtained prior to any record release in order to collaborate their care with an outside provider.  Patient/Guardian was advised if they have not already done so to contact the registration department to sign all necessary forms in order for [place] to release information regarding their care.   Consent: Patient/Guardian gives verbal consent for treatment and assignment of benefits for services provided during this visit. Patient/Guardian expressed understanding and agreed to proceed.     I provided 25 minutes of non face to face time during this encounter.  Note: This document was prepared by [employer] voice dictation technology and any errors that results from this process are unintentional.    Cleotis Nipper, MD 09/29/2023

## 2023-11-10 ENCOUNTER — Telehealth (HOSPITAL_BASED_OUTPATIENT_CLINIC_OR_DEPARTMENT_OTHER): Payer: 59 | Admitting: Psychiatry

## 2023-11-10 ENCOUNTER — Encounter (HOSPITAL_COMMUNITY): Payer: Self-pay | Admitting: Psychiatry

## 2023-11-10 VITALS — Wt 141.0 lb

## 2023-11-10 DIAGNOSIS — Z630 Problems in relationship with spouse or partner: Secondary | ICD-10-CM | POA: Diagnosis not present

## 2023-11-10 DIAGNOSIS — F419 Anxiety disorder, unspecified: Secondary | ICD-10-CM | POA: Diagnosis not present

## 2023-11-10 DIAGNOSIS — F330 Major depressive disorder, recurrent, mild: Secondary | ICD-10-CM

## 2023-11-10 MED ORDER — DULOXETINE HCL 20 MG PO CPEP: ORAL_CAPSULE | ORAL | 1 refills | Status: DC

## 2023-11-10 MED ORDER — LAMOTRIGINE 200 MG PO TABS: 200.0000 mg | ORAL_TABLET | Freq: Every day | ORAL | 0 refills | Status: DC

## 2023-11-10 MED ORDER — TRAZODONE HCL 150 MG PO TABS: 75.0000 mg | ORAL_TABLET | Freq: Every day | ORAL | 1 refills | Status: DC

## 2023-11-10 MED ORDER — GABAPENTIN 100 MG PO CAPS: 100.0000 mg | ORAL_CAPSULE | Freq: Three times a day (TID) | ORAL | 0 refills | Status: DC

## 2023-11-10 NOTE — Progress Notes (Signed)
 [facility] MD/PA/NP OP Progress Note  Virtual Visit via Video Note  I connected with Jaime [name] on 11/10/23 at  2:20 PM EDT by a video enabled telemedicine application and verified that I am speaking with the correct person using two identifiers.  Location: Patient: Home Provider: Office   I discussed the limitations of evaluation and management by telemedicine and the availability of in person appointments. The patient expressed understanding and agreed to proceed.  11/10/2023 2:00 PM Jaime [name]  MRN:  [id number]  Chief Complaint:  Chief Complaint  Patient presents with   Follow-up   Depression   Anxiety   HPI: Patient is evaluated by video session.  She reported things are not improved and despite taking Abilify she does not feel very happy.  She struggled with motivation, energy and feels blah most of the days.  She is doing only 1 house cleaning and after that she gets tired.  She noticed internal anxiety and restlessness.  She is on Abilify 5 mg but does not feel it is working very well for her.  She is taking half trazodone of 150 and sleep is okay.  She denies any suicidal thoughts or homicidal thoughts.  She denies any new concern in her marriage and so far she feels symptoms are manageable.  She tried to see Ms. [name] but her schedule is booked but she was able to get the appointment end of this month.  She is taking Lamictal, trazodone 75 mg at bedtime and gabapentin 100 mg 3 times a day.  Sometimes she sleeps too much.  She denies any hallucination, paranoia, homicidal thoughts.  Sometimes she feels irritable.  She denies drinking or using any illegal substances.  Her appetite is fair.  Her weight is unchanged from the past.  Visit Diagnosis:    ICD-10-CM   1. MDD (major depressive disorder), recurrent episode, mild (HCC)  F33.0 gabapentin (NEURONTIN) 100 MG capsule    DULoxetine (CYMBALTA) 20 MG capsule    lamoTRIgine (LAMICTAL) 200 MG tablet    traZODone  (DESYREL) 150 MG tablet    2. Anxiety  F41.9 gabapentin (NEURONTIN) 100 MG capsule    DULoxetine (CYMBALTA) 20 MG capsule    3. Marital stress  Z63.0 lamoTRIgine (LAMICTAL) 200 MG tablet      Past Psychiatric History: Reviewed H/O depression and anxiety. Tried Wellbutrin, Prozac, Zoloft, amitriptyline, Lexapro and doxepin.  No h/o mania, psychosis, hallucination, suicidal attempt or inpatient.   Past Medical History:  Past Medical History:  Diagnosis Date   Abnormal Pap smear of cervix ~2006   Anxiety    Depression    Fatigue     Past Surgical History:  Procedure Laterality Date   COLPOSCOPY  ~ 2006   Normal - per pt    Family Psychiatric History: Reviewed  Family History:  Family History  Adopted: Yes  Problem Relation Age of Onset   Breast cancer Other     Social History:  Social History   Socioeconomic History   Marital status: Married    Spouse name: Molly Maduro   Number of children: 0   Years of education: 12   Highest education level: Not on file  Occupational History   Occupation: Self employed  Tobacco Use   Smoking status: Never   Smokeless tobacco: Never  Vaping Use   Vaping status: Never Used  Substance and Sexual Activity   Alcohol use: No    Alcohol/week: 0.0 standard drinks of alcohol   Drug use: No  Sexual activity: Not Currently    Partners: Male    Birth control/protection: Post-menopausal  Other Topics Concern   Not on file  Social History Narrative   Right handed    Caffeine use: 1 Mt Dew every morning   Lives with husband   Social Drivers of Corporate investment banker Strain: Not on file  Food Insecurity: Not on file  Transportation Needs: Not on file  Physical Activity: Not on file  Stress: Not on file  Social Connections: Not on file    Allergies:  Allergies  Allergen Reactions   Penicillins     High school   Amitriptyline Anxiety    No longer taking    Metabolic Disorder Labs: No results found for: "HGBA1C",  "MPG" No results found for: "PROLACTIN" No results found for: "CHOL", "TRIG", "HDL", "CHOLHDL", "VLDL", "LDLCALC" No results found for: "TSH"  Therapeutic Level Labs: No results found for: "LITHIUM" No results found for: "VALPROATE" No results found for: "CBMZ"  Current Medications: Current Outpatient Medications  Medication Sig Dispense Refill   alendronate (FOSAMAX) 70 MG tablet Fosamax 70 mg tablet  Take 1 tablet every week by oral route.     ARIPiprazole (ABILIFY) 5 MG tablet Take 1 tablet (5 mg total) by mouth daily. 30 tablet 1   calcium carbonate (CALCIUM 600) 1500 (600 Ca) MG TABS tablet 1 tablet with meals     estradiol (ESTRACE) 0.1 MG/GM vaginal cream estradiol 0.01% (0.1 mg/gram) vaginal cream  Insert 1 g twice a week by vaginal route.     gabapentin (NEURONTIN) 100 MG capsule Take 1 capsule (100 mg total) by mouth 3 (three) times daily. 90 capsule 0   lamoTRIgine (LAMICTAL) 200 MG tablet Take 1 tablet (200 mg total) by mouth daily. 90 tablet 0   Multiple Vitamin (MULTIVITAMIN) tablet Take 1 tablet by mouth daily.     Omega-3 Fatty Acids (FISH OIL PO) Take by mouth daily.     traZODone (DESYREL) 100 MG tablet Take 1 tablet (100 mg total) by mouth at bedtime. 90 tablet 0   No current facility-administered medications for this visit.     Musculoskeletal: Strength & Muscle Tone: within normal limits Gait & Station: normal Patient leans: N/A  Psychiatric Specialty Exam: Review of Systems  Weight 141 lb (64 kg), last menstrual period 11/28/2012.There is no height or weight on file to calculate BMI.  General Appearance: Casual  Eye Contact:  Good  Speech:  Pressured  Volume:  Normal  Mood:  Dysphoric and Irritable  Affect:  Constricted  Thought Process:  Descriptions of Associations: Intact  Orientation:  Full (Time, Place, and Person)  Thought Content: Rumination   Suicidal Thoughts:  No  Homicidal Thoughts:  No  Memory:  Immediate;   Good Recent;    Good Remote;   Fair  Judgement:  Intact  Insight:  Present  Psychomotor Activity:  Increased  Concentration:  Concentration: Fair and Attention Span: Fair  Recall:  Good  Fund of Knowledge: Good  Language: Good  Akathisia:  No  Handed:  Right  AIMS (if indicated): not done  Assets:  Communication Skills Desire for Improvement Housing Transportation  ADL's:  Intact  Cognition: WNL  Sleep:  Fair   Screenings: GAD-7    Advertising copywriter from 09/08/2023 in [facility] at [place] [facility] from 11/10/2022 in [facility] at [facility] from 04/12/2022 in [facility] at [facility]  Total GAD-7 Score 14  7 17      PHQ2-9    Flowsheet Row Video Visit from 11/10/2023 in [facility] Counselor from 09/08/2023 in [facility] at [facility] from 01/12/2023 in [facility] at [facility] from 11/10/2022 in [facility] at [facility] from 04/12/2022 in [facility] at [facility] Total Score 2 4 1 2 3   PHQ-9 Total Score 7 14 -- 6 14      Flowsheet Row Counselor from 09/08/2023 in [facility] at [facility] from 01/12/2023 in [facility] at [facility] from 12/01/2022 in [facility] at [facility] RISK CATEGORY No Risk No Risk No Risk        Assessment and Plan: Patient is 58 year old female with history of depression, anxiety and marital stress.  She did not notice any improvement with increase Abilify and actually feeling more tired and sometimes restless inside.  Recommended discontinue Abilify.  She had never tried Cymbalta.  Will try Cymbalta 20 mg daily for 1 week and then twice a day.  Since cut down  the gabapentin she is not as sleepy.  Encouraged to continue gabapentin 100 mg 3 times a day and Lamictal 200 mg daily.  She has no rash, itching, tremors or shakes.  Encouraged to see Ms. [name] end of this month.  Will follow-up in 6 weeks in person visit.  I also encouraged to call us back if she has any noticed side effects from the new medication.  Patient agreed with the plan.  Discussed safety concerns at any time having active suicidal thoughts or homicidal thoughts and she need to call 911 or go to local emergency room.  PHQ screening done.  Numbers are a little bit better than before.  Collaboration of Care: Collaboration of Care: Other provider involved in patient's care AEB notes are available in epic to review  Patient/Guardian was advised Release of Information must be obtained prior to any record release in order to collaborate their care with an outside provider. Patient/Guardian was advised if they have not already done so to contact the registration department to sign all necessary forms in order for [place] to release information regarding their care.   Consent: Patient/Guardian gives verbal consent for treatment and assignment of benefits for services provided during this visit. Patient/Guardian expressed understanding and agreed to proceed.     Follow Up Instructions:    I discussed the assessment and treatment plan with the patient. The patient was provided an opportunity to ask questions and all were answered. The patient agreed with the plan and demonstrated an understanding of the instructions.   The patient was advised to call back or seek an in-person evaluation if the symptoms worsen or if the condition fails to improve as anticipated.  I provided 25 minutes of non-face-to-face time during this encounter.  Cleotis Nipper, MD 11/10/2023, 2:00 PM

## 2023-11-14 ENCOUNTER — Other Ambulatory Visit (HOSPITAL_COMMUNITY): Payer: Self-pay | Admitting: Psychiatry

## 2023-11-14 DIAGNOSIS — F330 Major depressive disorder, recurrent, mild: Secondary | ICD-10-CM

## 2023-11-28 ENCOUNTER — Ambulatory Visit (INDEPENDENT_AMBULATORY_CARE_PROVIDER_SITE_OTHER): Payer: BC Managed Care – PPO | Admitting: Clinical

## 2023-11-28 ENCOUNTER — Encounter (HOSPITAL_COMMUNITY): Payer: Self-pay | Admitting: Clinical

## 2023-11-28 DIAGNOSIS — F330 Major depressive disorder, recurrent, mild: Secondary | ICD-10-CM | POA: Diagnosis not present

## 2023-11-28 DIAGNOSIS — F419 Anxiety disorder, unspecified: Secondary | ICD-10-CM
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
## 2023-12-15 ENCOUNTER — Ambulatory Visit (HOSPITAL_COMMUNITY): Payer: BC Managed Care – PPO | Admitting: Clinical

## 2023-12-29 ENCOUNTER — Other Ambulatory Visit: Payer: Self-pay

## 2023-12-29 ENCOUNTER — Ambulatory Visit (HOSPITAL_COMMUNITY): Payer: BC Managed Care – PPO | Admitting: Clinical

## 2023-12-29 ENCOUNTER — Encounter (HOSPITAL_COMMUNITY): Payer: Self-pay | Admitting: Psychiatry

## 2023-12-29 ENCOUNTER — Encounter (HOSPITAL_COMMUNITY): Payer: Self-pay

## 2023-12-29 ENCOUNTER — Ambulatory Visit (HOSPITAL_COMMUNITY): Admitting: Psychiatry

## 2023-12-29 DIAGNOSIS — F330 Major depressive disorder, recurrent, mild: Secondary | ICD-10-CM

## 2023-12-29 DIAGNOSIS — F419 Anxiety disorder, unspecified: Secondary | ICD-10-CM | POA: Diagnosis not present

## 2023-12-29 MED ORDER — GABAPENTIN 100 MG PO CAPS: 100.0000 mg | ORAL_CAPSULE | Freq: Three times a day (TID) | ORAL | 2 refills | Status: DC

## 2023-12-29 MED ORDER — LAMOTRIGINE 200 MG PO TABS: 200.0000 mg | ORAL_TABLET | Freq: Every day | ORAL | 2 refills | Status: DC

## 2023-12-29 MED ORDER — DULOXETINE HCL 20 MG PO CPEP: ORAL_CAPSULE | ORAL | 2 refills | Status: DC

## 2023-12-29 MED ORDER — TRAZODONE HCL 50 MG PO TABS: 50.0000 mg | ORAL_TABLET | Freq: Every day | ORAL | 2 refills | Status: DC

## 2023-12-29 NOTE — Progress Notes (Signed)
 BH MD/PA/NP OP Progress Note  Patient Location: Office Provider Location: Office  12/29/2023 8:28 AM Tema Wainer  MRN:  [id number]  Chief Complaint:  Chief Complaint  Patient presents with   Follow-up   Medication Refill   HPI: Patient came today for her follow-up appointment.  We started her on Cymbalta  20 mg twice a day and she noticed much improvement in her mood.  She denies any crying spells, panic attack or any hopelessness.  She admitted eating good and sometimes sleeping too much.  She like to cut down the trazodone .  She used to take current 50 mg and we have cut down 75 and now she wants to cut down further.  She reported a better relationship with her husband since he is not drinking.  She continues to work on only 1 home and every other Saturday she goes to office.  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  She reported appetite is good.  Her energy level is good.  She has no tremors, shakes or any EPS.  She is taking trazodone  100 mg 3 times a day and Lamictal  200 mg daily.  She has no rash or any itching.  She has appointment coming up to see her primary care end of summer.  She denies any hallucination, paranoia or any agitation.  She like to keep the current medication.  Visit Diagnosis:    ICD-10-CM   1. MDD (major depressive disorder), recurrent episode, mild (HCC)  F33.0 lamoTRIgine  (LAMICTAL ) 200 MG tablet    traZODone  (DESYREL ) 50 MG tablet    DULoxetine  (CYMBALTA ) 20 MG capsule    gabapentin  (NEURONTIN ) 100 MG capsule    2. Anxiety  F41.9 DULoxetine  (CYMBALTA ) 20 MG capsule    gabapentin  (NEURONTIN ) 100 MG capsule      Past Psychiatric History: Reviewed H/O depression and anxiety. Tried Wellbutrin, Prozac, Zoloft , amitriptyline, Lexapro , Abilify  and doxepin .  No h/o mania, psychosis, hallucination, suicidal attempt or inpatient.   Past Medical History:   Past Medical History:  Diagnosis Date   Abnormal Pap smear of cervix ~2006   Anxiety    Depression    Fatigue     Past Surgical History:  Procedure Laterality Date   COLPOSCOPY  ~ 2006   Normal - per pt    Family Psychiatric History: Reviewed  Family History:  Family History  Adopted: Yes  Problem Relation Age of Onset   Breast cancer Other     Social History:  Social History   Socioeconomic History   Marital status: Married    Spouse name: Porfirio Bristol   Number of children: 0   Years of education: 12   Highest education level: Not on file  Occupational History   Occupation: Self employed  Tobacco Use   Smoking status: Never   Smokeless tobacco: Never  Vaping Use   Vaping status: Never Used  Substance and Sexual Activity   Alcohol use: No    Alcohol/week: 0.0 standard drinks of alcohol   Drug use: No   Sexual activity: Not Currently    Partners: Male    Birth control/protection: Post-menopausal  Other Topics Concern   Not on file  Social History Narrative   Right handed    Caffeine use: 1 Mt Dew every morning   Lives with husband   Social Drivers of Corporate investment banker Strain: Not on file  Food Insecurity: Not on file  Transportation Needs: Not on file  Physical Activity: Not on file  Stress: Not on file  Social Connections: Not on file    Allergies:  Allergies  Allergen Reactions   Penicillins     High school   Amitriptyline Anxiety    No longer taking    Metabolic Disorder Labs: No results found for: "HGBA1C", "MPG" No results found for: "PROLACTIN" No results found for: "CHOL", "TRIG", "HDL", "CHOLHDL", "VLDL", "LDLCALC" No results found for: "TSH"  Therapeutic Level Labs: No results found for: "LITHIUM" No results found for: "VALPROATE" No results found for: "CBMZ"  Current Medications: Current Outpatient Medications  Medication Sig Dispense Refill   alendronate (FOSAMAX) 70 MG tablet Fosamax 70 mg tablet  Take 1 tablet  every week by oral route.     calcium carbonate (CALCIUM 600) 1500 (600 Ca) MG TABS tablet 1 tablet with meals     DULoxetine  (CYMBALTA ) 20 MG capsule Take one capsule daily for one week and than twice daily 60 capsule 1   estradiol  (ESTRACE ) 0.1 MG/GM vaginal cream estradiol  0.01% (0.1 mg/gram) vaginal cream  Insert 1 g twice a week by vaginal route.     gabapentin  (NEURONTIN ) 100 MG capsule Take 1 capsule (100 mg total) by mouth 3 (three) times daily. 90 capsule 0   lamoTRIgine  (LAMICTAL ) 200 MG tablet Take 1 tablet (200 mg total) by mouth daily. 90 tablet 0   Multiple Vitamin (MULTIVITAMIN) tablet Take 1 tablet by mouth daily.     Omega-3 Fatty Acids (FISH OIL PO) Take by mouth daily.     traZODone  (DESYREL ) 150 MG tablet Take 0.5 tablets (75 mg total) by mouth at bedtime. 15 tablet 1   No current facility-administered medications for this visit.     Musculoskeletal: Strength & Muscle Tone: within normal limits Gait & Station: normal Patient leans: N/A  Psychiatric Specialty Exam: Review of Systems  Blood pressure 114/69, pulse 79, height 5\' 7"  (1.702 m), weight 140 lb (63.5 kg), last menstrual period 11/28/2012.Body mass index is 21.93 kg/m.  General Appearance: Casual  Eye Contact:  Good  Speech:  Clear and Coherent  Volume:  Normal  Mood:  Euthymic  Affect:  Appropriate  Thought Process:  Goal Directed  Orientation:  Full (Time, Place, and Person)  Thought Content: WDL   Suicidal Thoughts:  No  Homicidal Thoughts:  No  Memory:  Immediate;   Good Recent;   Good Remote;   Good  Judgement:  Good  Insight:  Present  Psychomotor Activity:  Normal  Concentration:  Concentration: Good and Attention Span: Good  Recall:  Good  Fund of Knowledge: Good  Language: Good  Akathisia:  No  Handed:  Right  AIMS (if indicated): not done  Assets:  Communication Skills Desire for Improvement Housing Resilience Social Support Talents/Skills Transportation  ADL's:  Intact   Cognition: WNL  Sleep:  Good   Screenings: GAD-7    Advertising copywriter from 09/08/2023 in [place] [facility] [place] Counselor from 11/10/2022 in [place] [facility] Counselor from 04/12/2022 in [facility] at [facility]  Total GAD-7 Score 14 7 17       PHQ2-9    Flowsheet Row Video Visit from [facility] from 09/08/2023 in [place] [facility] [place] [facility] Counselor from 01[facility]12/2023 in [facility] at [facility] from 11/10/2022 in [facility]  [facility] at [facility] from 04/12/2022 in [facility] at [facility] Total Score 2 4 1 2 3   PHQ-9 Total Score 7 14 -- 6 14      Flowsheet Row Counselor from 09/08/2023 in [place] [facility] at [facility] from 01/12/2023 in [facility] at [facility] from 12/01/2022 in [facility] at [facility] RISK CATEGORY No Risk No Risk No Risk        Assessment and Plan: Patient is 58 year old Caucasian female with history of depression, anxiety.  Her marital stress is much better since husband not drinking.  She also feels a new medicine working.  We had discontinued Abilify  after poor response and started her on Cymbalta  20 mg twice a week.  So far no major concern or side effects.  Discussed optimizing the dose but patient feel it is working very well and does not want to go on a higher dose.  She actually like to cut down the trazodone  as sleeping too much.  Currently she is taking 75 mg trazodone  and I recommend to try 50 mg and take only as needed.  Encouraged to continue therapy with Ms. Hilario Lover.  Continue gabapentin  100 mg 3 times a day.  Continues Lamictal  200 mg daily.  Recommend to  call us  back if she has any question or any concern.  Follow-up in 3 months in person in the office.  Collaboration of Care: Collaboration of Care: Other provider involved in patient's care AEB notes are available in epic to review  Patient/Guardian was advised Release of Information must be obtained prior to any record release in order to collaborate their care with an outside provider. Patient/Guardian was advised if they have not already done so to contact the registration department to sign all necessary forms in order for us  to release information regarding their care.   Consent: Patient/Guardian gives verbal consent for treatment and assignment of benefits for services provided during this visit. Patient/Guardian expressed understanding and agreed to proceed.    Arturo Late, MD 12/29/2023, 8:28 AM

## 2024-01-11 ENCOUNTER — Ambulatory Visit (HOSPITAL_COMMUNITY): Payer: BC Managed Care – PPO | Admitting: Clinical

## 2024-01-11 ENCOUNTER — Encounter (HOSPITAL_COMMUNITY): Payer: Self-pay

## 2024-01-11 ENCOUNTER — Encounter (HOSPITAL_COMMUNITY): Payer: Self-pay | Admitting: Clinical

## 2024-01-11 DIAGNOSIS — F330 Major depressive disorder, recurrent, mild: Secondary | ICD-10-CM

## 2024-01-11 DIAGNOSIS — F419 Anxiety disorder, unspecified: Secondary | ICD-10-CM | POA: Diagnosis not present

## 2024-01-11 DIAGNOSIS — Z630 Problems in relationship with spouse or partner: Secondary | ICD-10-CM | POA: Diagnosis not present

## 2024-01-11 NOTE — Progress Notes (Signed)
 THERAPIST PROGRESS NOTE  Session Time: 4:00pm-4:45pm  Session #3  Participation Level: Active  Behavioral Response: Casual and Neat Alert Euthymic  Type of Therapy: Individual Therapy  Treatment Goals addressed:  LTG:  Reduce overall frequency, intensity, and duration of the anxiety so that daily functioning is not impaired per pt self report 3 out of 5 sessions documented. STG: Enhance ability to effectively cope with the full variety of life's worries and anxiety per self report 3 out of 5 sessions documented STG: Jalin will reduce frequency of avoidant behaviors by 50% as evidenced by self-report in therapy sessions LTG: Work to Arts development officer from models like CBT, Stages of Change, DBT, shame resilience theory, ACT, SFBT, MI, trauma-informed therapy and others to be able to manage mental health symptoms, AEB practicing out of session and reporting back.   STG: Learn breathing techniques and grounding techniques at an age-appropriate and ability-appropriate level and demonstrate mastery in session then report independent use of these skills out of session.   LTG: Learn about boundary types, how to implement them, and how to enforce them so that feels more empowered and content with being able to maintain more helpful, appropriate boundaries in the future for a more balanced result.   LTG: Decrease depressive symptoms and improve levels of effective functioning-pt reports a decrease in overall depression symptoms 3 out of 5 sessions documented. STG: Develop healthy thinking patterns and beliefs about self, others, and the world that lead to the alleviation and help prevent the relapse of depression per self report 3 out of 5 sessions documented.   LTG: Score less than 9 on the PHQ-9 and less than 5 on the GAD-7 as evidenced by intermittent administration of the questionnaires to determine progress in managing depression and anxiety.   STG: Learn a variety of coping skills and  demonstrate the ability to use them to decrease feelings of sadness, anger, and fear and increase feelings of happiness, peace, and powerfulness AEB gauging those emotions on 1-10 scale.   STG: Learn about what forgiveness is and is not, then go through the four phases of forgiveness (uncovering, decision, work, deepening).   LTG: Identify and decrease cognitive distortions contributing negatively to mood and behavior by identifying 5-7 cognitive distortions that are present; learn how to come up with replacement thoughts that are more balanced, realistic, and helpful.   STG: Learn and practice communication techniques such as "I" statements, open-ended questions, reflective listening, assertiveness, fair fighting rules, active listening, initiating conversations, and more as necessary and taught in session. LTG: Learn about detachment and learn methods of remaining detached from loved ones so as not to become depressed about those individuals' choices   STG: Consider attendance at a 12-step program such as [facility] for support and more ideas on recovery.    ProgressTowards Goals: Progressing  Interventions: CBT, Psychosocial Skills: Fair Charity fundraiser, and Supportive  Summary: Amiayah Kunkler is a 58 y.o. female who presents with anxiety and depression, along with marital stress. She presented oriented x5 and stated she was feeling "happy."  CSW evaluated patient's medication compliance, use of coping tools, and self-care, as applicable.  She provided an update on various aspects of her life that are normally discussed in therapy, including her medications and family events.  We reviewed how she has implemented CBT since last session, which she has not felt was very needed since things are going very well for her right now.  She is excited about the medicine, stated  she is "back to the person I was."  As she discussed some of the recent progress she and her husband have made, CSW pointed out that  he is using some of the basics of the CBT concepts without even knowing it.  We also discussed how HALTS could potentially affect how any of us  reacts to another person.  We processed what Mother's Day was like for her this past weekend with her own mother and with her mother-in-law.   Finally, reviewed [employer] and provided her a handout to use in relationship with husband.  CSW talked with her about using "I" statements to ask that her husband not bring up her ex-boyfriends any longer.  She felt so good that she had little to talk about and CSW therefore guided most of the session, did psychoeducation.  She was offered the chance to take a break from therapy and stated she will do so since she is in a really good place right now.   Suicidal/Homicidal: No without intent/plan  Therapist Response: Patient is progressing AEB engaging in scheduled therapy session.  Throughout the session, CSW gave patient the opportunity to explore thoughts and feelings associated with current life situations and past/present stressors.   CSW challenged patient gently and appropriately to consider different ways of looking at reported issues. CSW encouraged patient's expression of feelings and validated these using empathy, active listening, open body language, and unconditional positive regard.   She shared that she feels in a good place right now and will call back for more sessions in the future if needed.   Recommendations:  Return to therapy as needed/desired.  Plan: Return again in 6 weeks on 5/14, as she needed to cancel her next appointment due to the increase in her co-pay  Diagnosis:  MDD (major depressive disorder), recurrent episode, mild (HCC)  Anxiety  Marital stress  Collaboration of Care: Psychiatrist AEB -psychiatrist can read therapy notes; therapist can and does read psychiatric notes prior to sessions   Patient/Guardian was advised Release of Information must be obtained prior to any  record release in order to collaborate their care with an outside provider. Patient/Guardian was advised if they have not already done so to contact the registration department to sign all necessary forms in order for us  to release information regarding their care.   Consent: Patient/Guardian gives verbal consent for treatment and assignment of benefits for services provided during this visit. Patient/Guardian expressed understanding and agreed to proceed.   Ancel Kass, LCSW 01/11/2024

## 2024-04-02 ENCOUNTER — Other Ambulatory Visit (HOSPITAL_COMMUNITY): Payer: Self-pay | Admitting: Psychiatry

## 2024-04-02 DIAGNOSIS — F330 Major depressive disorder, recurrent, mild: Secondary | ICD-10-CM

## 2024-04-02 DIAGNOSIS — F419 Anxiety disorder, unspecified: Secondary | ICD-10-CM

## 2024-04-05 ENCOUNTER — Other Ambulatory Visit: Payer: Self-pay

## 2024-04-05 ENCOUNTER — Ambulatory Visit (HOSPITAL_COMMUNITY): Admitting: Psychiatry

## 2024-04-05 ENCOUNTER — Encounter (HOSPITAL_COMMUNITY): Payer: Self-pay | Admitting: Psychiatry

## 2024-04-05 DIAGNOSIS — F330 Major depressive disorder, recurrent, mild: Secondary | ICD-10-CM

## 2024-04-05 DIAGNOSIS — F419 Anxiety disorder, unspecified: Secondary | ICD-10-CM

## 2024-04-05 MED ORDER — TRAZODONE HCL 50 MG PO TABS: 50.0000 mg | ORAL_TABLET | Freq: Every day | ORAL | 3 refills | Status: AC

## 2024-04-05 MED ORDER — GABAPENTIN 100 MG PO CAPS: 100.0000 mg | ORAL_CAPSULE | Freq: Three times a day (TID) | ORAL | 3 refills | Status: DC

## 2024-04-05 MED ORDER — LAMOTRIGINE 200 MG PO TABS: 200.0000 mg | ORAL_TABLET | Freq: Every day | ORAL | 3 refills | Status: DC

## 2024-04-05 MED ORDER — DULOXETINE HCL 20 MG PO CPEP: ORAL_CAPSULE | ORAL | 3 refills | Status: DC

## 2024-04-05 NOTE — Progress Notes (Signed)
 BH MD/PA/NP OP Progress Note  Patient Location: Office Provider Location: Office  04/05/2024 8:30 AM Jaime [name]  MRN:  [id number]  Chief Complaint:  Chief Complaint  Patient presents with   Follow-up   Medication Refill   HPI: Patient came today office for her follow-up appointment.  She is been doing very well and reported the medicine is working great and she has no issue.  She is taking a break from therapy for now.  She is sleeping good.  She reported husband is doing much better and has no drinking issue anymore.  Patient told he is still unemployed but try to help his friend whenever he can.  She denies any irritability, anger, mania, psychosis.  She denies any major panic attack.  She is only taking trazodone  25 mg which is helping her sleep all night.  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  She does not get overwhelmed.  She reported a better relationship with her husband and now he is very supportive.  Her appetite is okay.  She gained few pounds but so far her mood is stable and she like to keep the current medication.  She has no rash, itching, tremors or shakes.  She is taking gabapentin  100 mg 3 times a day and Cymbalta  20 mg twice a day and Lamictal  200 mg daily.  Her primary care is Dr. Suzen Gentry.  No new medication added.  Patient denies drinking.  Visit Diagnosis:    ICD-10-CM   1. MDD (major depressive disorder), recurrent episode, mild (HCC)  F33.0 DULoxetine  (CYMBALTA ) 20 MG capsule    lamoTRIgine  (LAMICTAL ) 200 MG tablet    gabapentin  (NEURONTIN ) 100 MG capsule    traZODone  (DESYREL ) 50 MG tablet    2. Anxiety  F41.9 DULoxetine  (CYMBALTA ) 20 MG capsule    gabapentin  (NEURONTIN ) 100 MG capsule       Past Psychiatric History: Reviewed H/O depression and anxiety. Tried Wellbutrin, Prozac, Zoloft , amitriptyline, Lexapro , Abilify  and doxepin .  No h/o mania, psychosis, hallucination, suicidal attempt or inpatient.   Past Medical History:   Past Medical History:  Diagnosis Date   Abnormal Pap smear of cervix ~2006   Anxiety    Depression    Fatigue     Past Surgical History:  Procedure Laterality Date   COLPOSCOPY  ~ 2006   Normal - per pt    Family Psychiatric History: Reviewed  Family History:  Family History  Adopted: Yes  Problem Relation Age of Onset   Breast cancer Other     Social History:  Social History   Socioeconomic History   Marital status: Married    Spouse name: Lamar   Number of children: 0   Years of education: 12   Highest education level: Not on file  Occupational History   Occupation: Self employed  Tobacco Use   Smoking status: Never   Smokeless tobacco: Never  Vaping Use   Vaping status: Never Used  Substance and Sexual Activity   Alcohol use: No    Alcohol/week: 0.0 standard drinks of alcohol   Drug use: No   Sexual activity: Not Currently    Partners: Male    Birth control/protection: Post-menopausal  Other Topics Concern   Not on file  Social History Narrative   Right handed    Caffeine use: 1 Mt Dew every morning   Lives with husband   Social Drivers of Corporate investment banker Strain: Not on file  Food  Insecurity: Not on file  Transportation Needs: Not on file  Physical Activity: Not on file  Stress: Not on file  Social Connections: Not on file    Allergies:  Allergies  Allergen Reactions   Penicillins     High school   Amitriptyline Anxiety    No longer taking    Metabolic Disorder Labs: No results found for: HGBA1C, MPG No results found for: PROLACTIN No results found for: CHOL, TRIG, HDL, CHOLHDL, VLDL, LDLCALC No results found for: TSH  Therapeutic Level Labs: No results found for: LITHIUM No results found for: VALPROATE No results found for: CBMZ  Current Medications: Current Outpatient Medications  Medication Sig Dispense Refill   alendronate (FOSAMAX) 70 MG tablet Fosamax 70 mg tablet  Take 1 tablet  every week by oral route.     calcium carbonate (CALCIUM 600) 1500 (600 Ca) MG TABS tablet 1 tablet with meals     DULoxetine  (CYMBALTA ) 20 MG capsule Take one capsule twice daily 60 capsule 2   estradiol  (ESTRACE ) 0.1 MG/GM vaginal cream estradiol  0.01% (0.1 mg/gram) vaginal cream  Insert 1 g twice a week by vaginal route.     gabapentin  (NEURONTIN ) 100 MG capsule Take 1 capsule (100 mg total) by mouth 3 (three) times daily. 90 capsule 2   lamoTRIgine  (LAMICTAL ) 200 MG tablet Take 1 tablet (200 mg total) by mouth daily. 30 tablet 2   Multiple Vitamin (MULTIVITAMIN) tablet Take 1 tablet by mouth daily.     Omega-3 Fatty Acids (FISH OIL PO) Take by mouth daily.     traZODone  (DESYREL ) 50 MG tablet Take 1 tablet (50 mg total) by mouth at bedtime. 30 tablet 2   No current facility-administered medications for this visit.     Musculoskeletal: Strength & Muscle Tone: within normal limits Gait & Station: normal Patient leans: N/A  Psychiatric Specialty Exam: Review of Systems  Blood pressure 130/80, pulse 80, height 5' 7 (1.702 m), weight 149 lb (67.6 kg), last menstrual period 11/28/2012.Body mass index is 23.34 kg/m.  General Appearance: Casual  Eye Contact:  Good  Speech:  Clear and Coherent  Volume:  Normal  Mood:  Pleasant  Affect:  Appropriate  Thought Process:  Goal Directed  Orientation:  Full (Time, Place, and Person)  Thought Content: WDL   Suicidal Thoughts:  No  Homicidal Thoughts:  No  Memory:  Immediate;   Good Recent;   Good Remote;   Good  Judgement:  Good  Insight:  Present  Psychomotor Activity:  Normal  Concentration:  Concentration: Good and Attention Span: Good  Recall:  Good  Fund of Knowledge: Good  Language: Good  Akathisia:  No  Handed:  Right  AIMS (if indicated): not done  Assets:  Communication Skills Desire for Improvement Housing Resilience Social Support Talents/Skills Transportation  ADL's:  Intact  Cognition: WNL  Sleep:  Good    Screenings: GAD-7    Advertising copywriter from 09/08/2023 in [place] [facility] [place] Counselor from 11/10/2022 in [place] [facility] Counselor from 04/12/2022 in [facility] at [facility]  Total GAD-7 Score 14 7 17    PHQ2-9    Flowsheet Row Video Visit from 11/10/2023 in [facility] from 09/08/2023 in [place] [facility] Counselor from 01/12/2023 in [place] [facility] at [facility] from 11/10/2022 in [facility] at [facility] from 04/12/2022 in [facility]  [facility] at [facility] Total Score 2 4 1 2 3   PHQ-9 Total Score 7 14 -- 6 14   Flowsheet Row Counselor from 09/08/2023 in [place] [facility] at [facility] from 01/12/2023 in [facility] at [facility] from 12/01/2022 in [facility] Out[facility] Health at [facility] RISK CATEGORY No Risk No Risk No Risk     Assessment and Plan: Patient is 58 year old Caucasian female with history of depression, anxiety.  She is stable on current medication.  She is taking a break from therapy.  She like to continue current medication.  No major concern from the medication.  Continue gabapentin  100 mg 3 times a day, Lamictal  200 mg daily, trazodone  50 mg but take half tablet as needed for insomnia and Cymbalta  20 mg 2 times a day.  Recommended to call back if she is any question or any concern.  Follow-up in 4 months.  Collaboration of Care: Collaboration of Care: Other provider involved in patient's care AEB notes are available in epic to review  Patient/Guardian was advised Release of Information must be obtained prior to any record release in order to collaborate their care with an outside provider.  Patient/Guardian was advised if they have not already done so to contact the registration department to sign all necessary forms in order for us  to release information regarding their care.   Consent: Patient/Guardian gives verbal consent for treatment and assignment of benefits for services provided during this visit. Patient/Guardian expressed understanding and agreed to proceed.    Leni ONEIDA Client, MD 04/05/2024, 8:30 AM

## 2024-08-09 ENCOUNTER — Ambulatory Visit (HOSPITAL_COMMUNITY): Admitting: Psychiatry

## 2024-08-09 ENCOUNTER — Other Ambulatory Visit: Payer: Self-pay

## 2024-08-09 ENCOUNTER — Encounter (HOSPITAL_COMMUNITY): Payer: Self-pay | Admitting: Psychiatry

## 2024-08-09 VITALS — BP 119/77 | HR 76 | Ht 67.0 in | Wt 148.0 lb

## 2024-08-09 DIAGNOSIS — F330 Major depressive disorder, recurrent, mild: Secondary | ICD-10-CM

## 2024-08-09 DIAGNOSIS — F419 Anxiety disorder, unspecified: Secondary | ICD-10-CM | POA: Diagnosis not present

## 2024-08-09 MED ORDER — GABAPENTIN 100 MG PO CAPS: 100.0000 mg | ORAL_CAPSULE | Freq: Three times a day (TID) | ORAL | 3 refills | Status: AC

## 2024-08-09 NOTE — Progress Notes (Signed)
 [facility] MD/PA/NP OP Progress Note  Patient Location: Office Provider Location: Office  08/09/2024 8:24 AM Jaime [name]  MRN:  [id number]  Chief Complaint:  Chief Complaint  Patient presents with   Follow-up   Medication Problem   HPI: Patient came to the office for her follow-up appointment.  She is taking Cymbalta , gabapentin , Lamictal .  She stopped taking trazodone  because sleeping well and has no issue.  She reported things are going very well.  She has a good Thanksgiving with her great grand niece and nephew.  Patient is happy as niece decided to move from [place]  to [place] .  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  She reported medicine working well and she has no major concern or side effects.  She reported husband also stopped drinking completely and started going to church and she is happy about it.  Patient denies any crying spells, feeling of hopelessness or worthlessness.  She reported last week was somewhat emotional because her 74 year old dog had health issues and complications from the antibiotic.  Patient is pleased things are much better.  She has no rash, itching tremors or shakes.  Her appetite is okay.  She sleeps good.  She denies any hopelessness or worthlessness patient denies any major panic attack.  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  Patient has appointment coming up with her primary care and OB/GYN next week.  She is hoping to have blood work on her appointment.  She denies any irritability or anger.  Her appetite is okay and weight is stable.  Visit Diagnosis:    ICD-10-CM   1. MDD (major depressive disorder), recurrent episode, mild  F33.0 DULoxetine  (CYMBALTA ) 20 MG capsule    gabapentin  (NEURONTIN ) 100 MG capsule    lamoTRIgine  (LAMICTAL ) 200 MG tablet    2. Anxiety  F41.9 DULoxetine  (CYMBALTA ) 20 MG capsule    gabapentin  (NEURONTIN ) 100 MG capsule        Past Psychiatric History:  Reviewed H/O depression and anxiety. Tried Wellbutrin, Prozac, Zoloft , amitriptyline, Lexapro , Abilify  and doxepin .  No h/o mania, psychosis, hallucination, suicidal attempt or inpatient.   Past Medical History:  Past Medical History:  Diagnosis Date   Abnormal Pap smear of cervix ~2006   Anxiety    Depression    Fatigue     Past Surgical History:  Procedure Laterality Date   COLPOSCOPY  ~ 2006   Normal - per pt    Family Psychiatric History: Reviewed  Family History:  Family History  Adopted: Yes  Problem Relation Age of Onset   Breast cancer Other     Social History:  Social History   Socioeconomic History   Marital status: Married    Spouse name: Lamar   Number of children: 0   Years of education: 12   Highest education level: Not on file  Occupational History   Occupation: Self employed  Tobacco Use   Smoking status: Never   Smokeless tobacco: Never  Vaping Use   Vaping status: Never Used  Substance and Sexual Activity   Alcohol use: No    Alcohol/week: 0.0 standard drinks of alcohol   Drug use: No   Sexual activity: Not Currently    Partners: Male    Birth control/protection: Post-menopausal  Other Topics Concern   Not on file  Social History Narrative   Right handed    Caffeine use: 1 Mt Dew every morning   Lives  with husband   Social Drivers of Health   Tobacco Use: Low Risk (08/09/2024)   Patient History    Smoking Tobacco Use: Never    Smokeless Tobacco Use: Never    Passive Exposure: Not on file  Financial Resource Strain: Not on file  Food Insecurity: Not on file  Transportation Needs: Not on file  Physical Activity: Not on file  Stress: Not on file  Social Connections: Not on file  Depression (PHQ2-9): Medium Risk (11/10/2023)   Depression (PHQ2-9)    PHQ-2 Score: 7  Alcohol Screen: Not on file  Housing: Not on file  Utilities: Not on file  Health Literacy: Not on file    Allergies:  Allergies  Allergen Reactions    Penicillins     High school   Amitriptyline Anxiety    No longer taking    Metabolic Disorder Labs: No results found for: HGBA1C, MPG No results found for: PROLACTIN No results found for: CHOL, TRIG, HDL, CHOLHDL, VLDL, LDLCALC No results found for: TSH  Therapeutic Level Labs: No results found for: LITHIUM No results found for: VALPROATE No results found for: CBMZ  Current Medications: Current Outpatient Medications  Medication Sig Dispense Refill   alendronate (FOSAMAX) 70 MG tablet Fosamax 70 mg tablet  Take 1 tablet every week by oral route.     calcium carbonate (CALCIUM 600) 1500 (600 Ca) MG TABS tablet 1 tablet with meals     DULoxetine  (CYMBALTA ) 20 MG capsule Take one capsule twice daily 60 capsule 3   estradiol  (ESTRACE ) 0.1 MG/GM vaginal cream estradiol  0.01% (0.1 mg/gram) vaginal cream  Insert 1 g twice a week by vaginal route.     gabapentin  (NEURONTIN ) 100 MG capsule Take 1 capsule (100 mg total) by mouth 3 (three) times daily. 90 capsule 3   lamoTRIgine  (LAMICTAL ) 200 MG tablet Take 1 tablet (200 mg total) by mouth daily. 30 tablet 3   Multiple Vitamin (MULTIVITAMIN) tablet Take 1 tablet by mouth daily.     Omega-3 Fatty Acids (FISH OIL PO) Take by mouth daily.     traZODone  (DESYREL ) 50 MG tablet Take 1 tablet (50 mg total) by mouth at bedtime. 30 tablet 3   No current facility-administered medications for this visit.     Musculoskeletal: Strength & Muscle Tone: within normal limits Gait & Station: normal Patient leans: N/A  Psychiatric Specialty Exam: Review of Systems  Blood pressure 119/77, pulse 76, weight 148 lb (67.1 kg), last menstrual period 11/28/2012.Body mass index is 23.18 kg/m.  General Appearance: Casual  Eye Contact:  Good  Speech:  Clear and Coherent and Normal Rate  Volume:  Normal  Mood:  Pleasant  Affect:  Appropriate  Thought Process:  Goal Directed  Orientation:  Full (Time, Place, and Person)  Thought  Content: WDL   Suicidal Thoughts:  No  Homicidal Thoughts:  No  Memory:  Immediate;   Good Recent;   Good Remote;   Good  Judgement:  Good  Insight:  Present  Psychomotor Activity:  Normal  Concentration:  Concentration: Good and Attention Span: Good  Recall:  Good  Fund of Knowledge: Good  Language: Good  Akathisia:  No  Handed:  Right  AIMS (if indicated): not done  Assets:  Communication Skills Desire for Improvement Housing Resilience Social Support Talents/Skills Transportation  ADL's:  Intact  Cognition: WNL  Sleep:  Good, not taking trazodone .    Screenings: GAD-7    Flowsheet Row Counselor from 09/08/2023 in [facility] at [facility] from 11/10/2022 in [facility] at [facility] from 04/12/2022 in [facility] at [facility]  Total GAD-7 Score 14 7 17    PHQ2-9    Flowsheet Row Video Visit from 11/10/2023 in [facility] Counselor from 09/08/2023 in [facility] at [facility] from 01[facility]12/2023 in [facility] at [facility] from 11[facility]10/2022 in [facility] at [facility] from 04/12/2022 in [facility] Outpatient Behavioral Health at [facility] Total Score 2 4 1 2 3   PHQ-9 Total Score 7 14 -- 6 14   Flowsheet Row Counselor from 09/08/2023 in [facility] at [facility] from 01/12/2023 in [facility]al [facility] at [facility] from 12/01/2022 in [facility] Out[facility] Behavioral [facility] at [facility] RISK CATEGORY No Risk No Risk No Risk     Assessment and Plan: Patient is 58 year old Caucasian female with major depressive disorder and anxiety.  She is doing very well and has not required trazodone  in a while.  She was prescribed 50 mg which she takes as  needed half tablet but does not feel she needed anymore.  She has refill remaining and does not need any refill.  Discussed medication side effects and benefits.  Continue gabapentin  100 mg 3 times a day, Lamictal  200 mg daily and Cymbalta  20 mg twice a day.  Discussed medication side effects and benefits.  Recommend to call back if she has any question or any concern.  Follow-up in 4 months.  She is going to have appointment with her OB/GYN and PCP next week.  Encouraged to have her blood work result faxed to us .   Collaboration of Care: Collaboration of Care: Other provider involved in patient's care AEB notes are available in epic to review  Patient/Guardian was advised Release of Information must be obtained prior to any record release in order to collaborate their care with an outside provider. Patient/Guardian was advised if they have not already done so to contact the registration department to sign all necessary forms in order for us  to release information regarding their care.   Consent: Patient/Guardian gives verbal consent for treatment and assignment of benefits for services provided during this visit. Patient/Guardian expressed understanding and agreed to proceed.    Leni ONEIDA Client, MD 08/09/2024, 8:24 AM

## 2024-12-13 ENCOUNTER — Ambulatory Visit (HOSPITAL_COMMUNITY): Admitting: Psychiatry
